# Patient Record
Sex: Male | Born: 1955 | Race: White | Hispanic: No | Smoking: Former smoker
Health system: Southern US, Community
[De-identification: ages and names within clinical notes are randomized; demographics above are authoritative.]

## PROBLEM LIST (undated history)

## (undated) DIAGNOSIS — M109 Gout, unspecified: Secondary | ICD-10-CM

## (undated) DIAGNOSIS — R3915 Urgency of urination: Secondary | ICD-10-CM

## (undated) DIAGNOSIS — Z8719 Personal history of other diseases of the digestive system: Secondary | ICD-10-CM

## (undated) DIAGNOSIS — N201 Calculus of ureter: Secondary | ICD-10-CM

## (undated) DIAGNOSIS — R350 Frequency of micturition: Secondary | ICD-10-CM

## (undated) DIAGNOSIS — M1990 Unspecified osteoarthritis, unspecified site: Secondary | ICD-10-CM

## (undated) DIAGNOSIS — E039 Hypothyroidism, unspecified: Secondary | ICD-10-CM

## (undated) DIAGNOSIS — Z87442 Personal history of urinary calculi: Secondary | ICD-10-CM

## (undated) DIAGNOSIS — Z8709 Personal history of other diseases of the respiratory system: Secondary | ICD-10-CM

## (undated) DIAGNOSIS — D649 Anemia, unspecified: Secondary | ICD-10-CM

## (undated) DIAGNOSIS — N135 Crossing vessel and stricture of ureter without hydronephrosis: Secondary | ICD-10-CM

## (undated) DIAGNOSIS — Z973 Presence of spectacles and contact lenses: Secondary | ICD-10-CM

## (undated) HISTORY — PX: TONSILLECTOMY: SUR1361

---

## 2001-02-16 ENCOUNTER — Ambulatory Visit (HOSPITAL_COMMUNITY): Admission: RE | Admit: 2001-02-16 | Discharge: 2001-02-16 | Payer: Self-pay | Admitting: Gastroenterology

## 2011-03-12 ENCOUNTER — Emergency Department (HOSPITAL_COMMUNITY)
Admission: EM | Admit: 2011-03-12 | Discharge: 2011-03-12 | Disposition: A | Discharge status: Home / Self Care | Payer: 59 | Attending: Emergency Medicine | Admitting: Emergency Medicine

## 2011-03-12 ENCOUNTER — Emergency Department (HOSPITAL_COMMUNITY): Payer: 59

## 2011-03-12 DIAGNOSIS — K219 Gastro-esophageal reflux disease without esophagitis: Secondary | ICD-10-CM | POA: Insufficient documentation

## 2011-03-12 DIAGNOSIS — M109 Gout, unspecified: Secondary | ICD-10-CM | POA: Insufficient documentation

## 2011-03-12 DIAGNOSIS — R109 Unspecified abdominal pain: Secondary | ICD-10-CM | POA: Insufficient documentation

## 2011-03-12 DIAGNOSIS — N201 Calculus of ureter: Secondary | ICD-10-CM | POA: Insufficient documentation

## 2011-03-12 DIAGNOSIS — R1031 Right lower quadrant pain: Secondary | ICD-10-CM | POA: Insufficient documentation

## 2011-05-10 ENCOUNTER — Ambulatory Visit: Payer: Self-pay | Admitting: Urology

## 2011-05-17 ENCOUNTER — Ambulatory Visit: Payer: Self-pay | Admitting: Urology

## 2011-06-03 ENCOUNTER — Ambulatory Visit: Payer: Self-pay | Admitting: Urology

## 2011-06-15 HISTORY — PX: EXTRACORPOREAL SHOCK WAVE LITHOTRIPSY: SHX1557

## 2011-06-17 ENCOUNTER — Ambulatory Visit: Payer: Self-pay | Admitting: Urology

## 2011-07-21 ENCOUNTER — Ambulatory Visit: Payer: Self-pay | Admitting: Urology

## 2012-09-20 ENCOUNTER — Encounter (HOSPITAL_COMMUNITY): Payer: Self-pay | Admitting: Emergency Medicine

## 2012-09-20 ENCOUNTER — Emergency Department (HOSPITAL_COMMUNITY): Payer: 59

## 2012-09-20 ENCOUNTER — Emergency Department (HOSPITAL_COMMUNITY)
Admission: EM | Admit: 2012-09-20 | Discharge: 2012-09-21 | Disposition: A | Discharge status: Home / Self Care | Payer: 59 | Attending: Emergency Medicine | Admitting: Emergency Medicine

## 2012-09-20 DIAGNOSIS — R1084 Generalized abdominal pain: Secondary | ICD-10-CM | POA: Insufficient documentation

## 2012-09-20 DIAGNOSIS — Z79899 Other long term (current) drug therapy: Secondary | ICD-10-CM | POA: Insufficient documentation

## 2012-09-20 DIAGNOSIS — D649 Anemia, unspecified: Secondary | ICD-10-CM | POA: Insufficient documentation

## 2012-09-20 DIAGNOSIS — R141 Gas pain: Secondary | ICD-10-CM | POA: Insufficient documentation

## 2012-09-20 DIAGNOSIS — K449 Diaphragmatic hernia without obstruction or gangrene: Secondary | ICD-10-CM

## 2012-09-20 DIAGNOSIS — R109 Unspecified abdominal pain: Secondary | ICD-10-CM

## 2012-09-20 DIAGNOSIS — R142 Eructation: Secondary | ICD-10-CM | POA: Insufficient documentation

## 2012-09-20 DIAGNOSIS — R112 Nausea with vomiting, unspecified: Secondary | ICD-10-CM

## 2012-09-20 DIAGNOSIS — R6889 Other general symptoms and signs: Secondary | ICD-10-CM | POA: Insufficient documentation

## 2012-09-20 DIAGNOSIS — R61 Generalized hyperhidrosis: Secondary | ICD-10-CM | POA: Insufficient documentation

## 2012-09-20 DIAGNOSIS — R42 Dizziness and giddiness: Secondary | ICD-10-CM | POA: Insufficient documentation

## 2012-09-20 HISTORY — DX: Anemia, unspecified: D64.9

## 2012-09-20 LAB — COMPREHENSIVE METABOLIC PANEL
ALT: 23 U/L (ref 0–53)
Alkaline Phosphatase: 63 U/L (ref 39–117)
CO2: 22 mEq/L (ref 19–32)
Chloride: 104 mEq/L (ref 96–112)
GFR calc Af Amer: 80 mL/min — ABNORMAL LOW (ref >90)
GFR calc non Af Amer: 69 mL/min — ABNORMAL LOW (ref >90)
Glucose, Bld: 99 mg/dL (ref 70–99)
Potassium: 3.7 mEq/L (ref 3.5–5.1)
Sodium: 142 mEq/L (ref 135–145)

## 2012-09-20 LAB — CBC WITH DIFFERENTIAL/PLATELET
Lymphocytes Relative: 12 % (ref 12–46)
Lymphs Abs: 1.5 10*3/uL (ref 0.7–4.0)
MCV: 74 fL — ABNORMAL LOW (ref 78.0–100.0)
Neutrophils Relative %: 81 % — ABNORMAL HIGH (ref 43–77)
Platelets: 306 10*3/uL (ref 150–400)
RBC: 5.34 MIL/uL (ref 4.22–5.81)
WBC: 12.6 10*3/uL — ABNORMAL HIGH (ref 4.0–10.5)

## 2012-09-20 LAB — GLUCOSE, CAPILLARY

## 2012-09-20 MED ORDER — METOCLOPRAMIDE HCL 5 MG/ML IJ SOLN
10.0000 mg | Freq: Once | INTRAMUSCULAR | Status: AC
  Administered 2012-09-20: 10 mg via INTRAVENOUS
  Filled 2012-09-20: qty 2

## 2012-09-20 MED ORDER — IOHEXOL 300 MG/ML  SOLN
50.0000 mL | Freq: Once | INTRAMUSCULAR | Status: AC | PRN
  Administered 2012-09-20: 50 mL via ORAL

## 2012-09-20 MED ORDER — SODIUM CHLORIDE 0.9 % IV BOLUS (SEPSIS)
1000.0000 mL | Freq: Once | INTRAVENOUS | Status: AC
  Administered 2012-09-20: 1000 mL via INTRAVENOUS

## 2012-09-20 MED ORDER — ACETAMINOPHEN 325 MG PO TABS
650.0000 mg | ORAL_TABLET | Freq: Once | ORAL | Status: AC
  Administered 2012-09-20: 650 mg via ORAL
  Filled 2012-09-20: qty 1

## 2012-09-20 MED ORDER — ONDANSETRON 8 MG PO TBDP: ORAL_TABLET | ORAL | Status: DC

## 2012-09-20 MED ORDER — ONDANSETRON HCL 4 MG/2ML IJ SOLN
4.0000 mg | Freq: Once | INTRAMUSCULAR | Status: AC
  Administered 2012-09-20: 4 mg via INTRAVENOUS
  Filled 2012-09-20: qty 2

## 2012-09-20 MED ORDER — IOHEXOL 300 MG/ML  SOLN
100.0000 mL | Freq: Once | INTRAMUSCULAR | Status: AC | PRN
  Administered 2012-09-20: 100 mL via INTRAVENOUS

## 2012-09-20 NOTE — ED Provider Notes (Signed)
Caidynce Muzyka S 8:30 PM patient discussed in sign out with Tatyana Kirichenko PA-C.  Patient with acute onset generalized fatigue, weakness, nausea and vomiting. Also having some subjective chills. No diarrhea symptoms. No recent travel or known sick contacts. Patient does have some periumbilical abdominal discomforts on exam no peritoneal signs. CT scan pending to rule out any concerning or acute abdominal process.  Patient does report improvement of nausea. A CT scan did not show any significant findings there is a hiatal hernia the patient is aware of. Slight hepatic steatosis and some nonobstructing kidney stones. Will continue to give fluids.  Patient now sleeping comfortably continues to feel improved. He is able to tolerate by mouth fluids. His symptoms may represent viral syndrome. Patient encouraged to followup with PCP.  Angus Seller, PA-C 09/21/12 Jeralyn Bennett

## 2012-09-20 NOTE — ED Provider Notes (Signed)
History     CSN: [id number]  Arrival date & time 09/20/12  1745   First MD Initiated Contact with Patient 09/20/12 1838      Chief Complaint  Patient presents with  . Weakness  . Abnormal Lab    (Consider location/radiation/quality/duration/timing/severity/associated sxs/prior treatment) HPI Jay Mercer is a 57 y.o. male who presents to ED with complaint of weakness, dizziness, nausea, vomiting, onset about 2-3hrs ago while at a store. States symptoms occurred suddenly. States having also abdominal distention. States got dizzy diaphoretic, went to drink some water, and started vomiting "yellow bile." States hx of anemia, has not been taking his iron. Denies chest pain or shortness of breath. No other medical problems.    Past Medical History  Diagnosis Date  . Anemia     No past surgical history on file.  No family history on file.  History  Substance Use Topics  . Smoking status: Not on file  . Smokeless tobacco: Not on file  . Alcohol Use: Not on file      Review of Systems  Constitutional: Positive for fatigue. Negative for fever and chills.  HENT: Negative for neck pain.   Respiratory: Negative.   Cardiovascular: Negative.   Gastrointestinal: Positive for nausea, vomiting and abdominal pain. Negative for diarrhea, constipation and blood in stool.  Neurological: Positive for dizziness, weakness and light-headedness. Negative for headaches.  All other systems reviewed and are negative.    Allergies  Review of patient's allergies indicates no known allergies.  Home Medications   Current Outpatient Rx  Name  Route  Sig  Dispense  Refill  . colchicine 0.6 MG tablet   Oral   Take 0.6 mg by mouth daily.         . ferrous gluconate (FERGON) 324 MG tablet   Oral   Take 324 mg by mouth daily with breakfast.         . fish oil-omega-3 fatty acids 1000 MG capsule   Oral   Take 2 g by mouth daily.         Marland Kitchen levothyroxine (SYNTHROID, LEVOTHROID) 75  MCG tablet   Oral   Take 75 mcg by mouth daily before breakfast.         . pantoprazole (PROTONIX) 40 MG tablet   Oral   Take 40 mg by mouth daily.           BP 104/83  Pulse 110  Temp(Src) 98.6 F (37 C) (Oral)  SpO2 99%  Physical Exam  Nursing note and vitals reviewed. Constitutional: He is oriented to person, place, and time. He appears well-developed and well-nourished. No distress.  HENT:  Head: Normocephalic.  Eyes: Conjunctivae are normal.  Neck: Neck supple.  Cardiovascular: Normal rate, regular rhythm and normal heart sounds.   Pulmonary/Chest: Effort normal and breath sounds normal. No respiratory distress. He has no wheezes. He has no rales.  Abdominal: Soft. Bowel sounds are normal. He exhibits no distension. There is tenderness. There is no rebound.  Mild diffuse tenderness  Musculoskeletal: He exhibits no edema.  Neurological: He is alert and oriented to person, place, and time. No cranial nerve deficit. Coordination normal.  Skin: Skin is warm and dry.    ED Course  Procedures (including critical care time)   Date: 09/20/2012  Rate: 99  Rhythm: normal sinus rhythm  QRS Axis: normal  Intervals: normal  ST/T Wave abnormalities: normal  Conduction Disutrbances:right bundle branch block  Narrative Interpretation:   Old EKG Reviewed: none  available  Results for orders placed during the hospital encounter of 09/20/12  CBC WITH DIFFERENTIAL      Result Value Range   WBC 12.6 (*) 4.0 - 10.5 K/uL   RBC 5.34  4.22 - 5.81 MIL/uL   Hemoglobin 12.3 (*) 13.0 - 17.0 g/dL   HCT 16.1  09.6 - 04.5 %   MCV 74.0 (*) 78.0 - 100.0 fL   MCH 23.0 (*) 26.0 - 34.0 pg   MCHC 31.1  30.0 - 36.0 g/dL   RDW 40.9  81.1 - 91.4 %   Platelets 306  150 - 400 K/uL   Neutrophils Relative 81 (*) 43 - 77 %   Neutro Abs 10.2 (*) 1.7 - 7.7 K/uL   Lymphocytes Relative 12  12 - 46 %   Lymphs Abs 1.5  0.7 - 4.0 K/uL   Monocytes Relative 6  3 - 12 %   Monocytes Absolute 0.8  0.1 -  1.0 K/uL   Eosinophils Relative 1  0 - 5 %   Eosinophils Absolute 0.2  0.0 - 0.7 K/uL   Basophils Relative 1  0 - 1 %   Basophils Absolute 0.1  0.0 - 0.1 K/uL  COMPREHENSIVE METABOLIC PANEL      Result Value Range   Sodium 142  135 - 145 mEq/L   Potassium 3.7  3.5 - 5.1 mEq/L   Chloride 104  96 - 112 mEq/L   CO2 22  19 - 32 mEq/L   Glucose, Bld 99  70 - 99 mg/dL   BUN 18  6 - 23 mg/dL   Creatinine, Ser 7.82  0.50 - 1.35 mg/dL   Calcium 9.6  8.4 - 95.6 mg/dL   Total Protein 7.9  6.0 - 8.3 g/dL   Albumin 4.3  3.5 - 5.2 g/dL   AST 23  0 - 37 U/L   ALT 23  0 - 53 U/L   Alkaline Phosphatase 63  39 - 117 U/L   Total Bilirubin 1.1  0.3 - 1.2 mg/dL   GFR calc non Af Amer 69 (*) >90 mL/min   GFR calc Af Amer 80 (*) >90 mL/min  LIPASE, BLOOD      Result Value Range   Lipase 51  11 - 59 U/L  TROPONIN I      Result Value Range   Troponin I <0.30  <0.30 ng/mL  GLUCOSE, CAPILLARY      Result Value Range   Glucose-Capillary 112 (*) 70 - 99 mg/dL   Comment 1 Notify RN    POCT I-STAT TROPONIN I      Result Value Range   Troponin i, poc 0.02  0.00 - 0.08 ng/mL   Comment 3             Dg Abd Acute W/chest  09/20/2012  *RADIOLOGY REPORT*  Clinical Data: Abdominal distention and pain  ACUTE ABDOMEN SERIES (ABDOMEN 2 VIEW & CHEST 1 VIEW)  Comparison: 09/22/2011  Findings: Normal heart size.  No pleural effusion or edema.  There is no airspace consolidation identified.  Moderate size hiatal hernia is noted.  Nonspecific bowel gas pattern.  Gas and stool noted throughout the colon.  A few prominent loops of small bowel are noted.  And there are several air-fluid levels in the right lower quadrant of the abdomen.  No abnormal abdominal or pelvic calcifications identified.  IMPRESSION:  1.  No acute findings.  No active cardiopulmonary abnormalities. 2.  Hiatal hernia.   Original Report Authenticated  By: Signa Kell, M.D.    Pt with abdominal pain, vomiting. CT abd/pelvis ordered. Pt signed out with  PA dammen at shift change. Disposition based on CT abd/pelvis results.      No diagnosis found.    MDM          Lottie Mussel, PA-C 09/23/12 0132

## 2012-09-20 NOTE — ED Notes (Signed)
Pt states that he was trying to buy a washer today when he became, dizzy, lightheaded, and nauseated.  States hx of low hemoglobin and says that his doctors never figured out where he was bleeding from.  Pt is currently diaphoretic.  Pt c/o abd pain and distention.  Pt has vomited once.  States it was yellow.

## 2012-09-20 NOTE — ED Notes (Signed)
Pt has been having low hgb for a while and has been scoped and they have not been able to find out the reason for this. Pt today has not been feeling well and weak, pale, states that his abd feels distended.

## 2012-09-20 NOTE — ED Notes (Signed)
[id number]<AV> Expected date:<BR> Expected time:<BR> Means of arrival:<BR> Comments:<BR> No monitor

## 2012-09-21 NOTE — ED Provider Notes (Signed)
Medical screening examination/treatment/procedure(s) were performed by non-physician practitioner and as supervising physician I was immediately available for consultation/collaboration.  Shacola Schussler T Lukasz Rogus, MD 09/21/12 1847 

## 2012-09-23 NOTE — ED Provider Notes (Signed)
Medical screening examination/treatment/procedure(s) were performed by non-physician practitioner and as supervising physician I was immediately available for consultation/collaboration.  Carigan Lister T Sheretha Shadd, MD 09/23/12 0744 

## 2013-09-21 ENCOUNTER — Encounter (HOSPITAL_COMMUNITY): Payer: Self-pay | Admitting: Emergency Medicine

## 2013-09-21 ENCOUNTER — Emergency Department (HOSPITAL_COMMUNITY): Payer: 59

## 2013-09-21 ENCOUNTER — Emergency Department (HOSPITAL_COMMUNITY)
Admission: EM | Admit: 2013-09-21 | Discharge: 2013-09-21 | Disposition: A | Discharge status: Home / Self Care | Payer: 59 | Attending: Emergency Medicine | Admitting: Emergency Medicine

## 2013-09-21 DIAGNOSIS — Z87442 Personal history of urinary calculi: Secondary | ICD-10-CM | POA: Insufficient documentation

## 2013-09-21 DIAGNOSIS — E039 Hypothyroidism, unspecified: Secondary | ICD-10-CM | POA: Insufficient documentation

## 2013-09-21 DIAGNOSIS — N201 Calculus of ureter: Secondary | ICD-10-CM

## 2013-09-21 DIAGNOSIS — Z79899 Other long term (current) drug therapy: Secondary | ICD-10-CM | POA: Insufficient documentation

## 2013-09-21 LAB — BASIC METABOLIC PANEL
BUN: 18 mg/dL (ref 6–23)
CO2: 21 mEq/L (ref 19–32)
CREATININE: 1.25 mg/dL (ref 0.50–1.35)
Calcium: 9.4 mg/dL (ref 8.4–10.5)
Chloride: 103 mEq/L (ref 96–112)
GFR, EST AFRICAN AMERICAN: 73 mL/min — AB (ref >90)
GFR, EST NON AFRICAN AMERICAN: 63 mL/min — AB (ref >90)
Glucose, Bld: 122 mg/dL — ABNORMAL HIGH (ref 70–99)
POTASSIUM: 3.8 meq/L (ref 3.7–5.3)
Sodium: 140 mEq/L (ref 137–147)

## 2013-09-21 LAB — CBC WITH DIFFERENTIAL/PLATELET
BASOS ABS: 0.1 10*3/uL (ref 0.0–0.1)
BASOS PCT: 1 % (ref 0–1)
EOS ABS: 0.2 10*3/uL (ref 0.0–0.7)
Eosinophils Relative: 3 % (ref 0–5)
HCT: 36.8 % — ABNORMAL LOW (ref 39.0–52.0)
HEMOGLOBIN: 11.8 g/dL — AB (ref 13.0–17.0)
Lymphocytes Relative: 45 % (ref 12–46)
Lymphs Abs: 3.5 10*3/uL (ref 0.7–4.0)
MCH: 24.8 pg — AB (ref 26.0–34.0)
MCHC: 32.1 g/dL (ref 30.0–36.0)
MCV: 77.3 fL — ABNORMAL LOW (ref 78.0–100.0)
MONO ABS: 0.7 10*3/uL (ref 0.1–1.0)
MONOS PCT: 8 % (ref 3–12)
NEUTROS PCT: 43 % (ref 43–77)
Neutro Abs: 3.4 10*3/uL (ref 1.7–7.7)
Platelets: 272 10*3/uL (ref 150–400)
RBC: 4.76 MIL/uL (ref 4.22–5.81)
RDW: 15.1 % (ref 11.5–15.5)
WBC: 7.9 10*3/uL (ref 4.0–10.5)

## 2013-09-21 LAB — URINALYSIS, ROUTINE W REFLEX MICROSCOPIC
Glucose, UA: NEGATIVE mg/dL
KETONES UR: NEGATIVE mg/dL
NITRITE: NEGATIVE
Protein, ur: 30 mg/dL — AB
Specific Gravity, Urine: 1.026 (ref 1.005–1.030)
UROBILINOGEN UA: 0.2 mg/dL (ref 0.0–1.0)
pH: 5 (ref 5.0–8.0)

## 2013-09-21 LAB — URINE MICROSCOPIC-ADD ON

## 2013-09-21 MED ORDER — MORPHINE SULFATE 4 MG/ML IJ SOLN
4.0000 mg | INTRAMUSCULAR | Status: DC | PRN
  Administered 2013-09-21 (×2): 4 mg via INTRAVENOUS
  Filled 2013-09-21 (×2): qty 1

## 2013-09-21 MED ORDER — HYDROCODONE-ACETAMINOPHEN 5-325 MG PO TABS: 1.0000 | ORAL_TABLET | Freq: Four times a day (QID) | ORAL | Status: DC | PRN

## 2013-09-21 MED ORDER — IBUPROFEN 600 MG PO TABS: 600.0000 mg | ORAL_TABLET | Freq: Four times a day (QID) | ORAL | Status: DC | PRN

## 2013-09-21 MED ORDER — OXYCODONE-ACETAMINOPHEN 5-325 MG PO TABS
1.0000 | ORAL_TABLET | Freq: Once | ORAL | Status: AC
  Administered 2013-09-21: 2 via ORAL
  Filled 2013-09-21: qty 2

## 2013-09-21 MED ORDER — KETOROLAC TROMETHAMINE 30 MG/ML IJ SOLN
30.0000 mg | Freq: Once | INTRAMUSCULAR | Status: AC
  Administered 2013-09-21: 30 mg via INTRAVENOUS
  Filled 2013-09-21: qty 1

## 2013-09-21 MED ORDER — ONDANSETRON 8 MG PO TBDP
8.0000 mg | ORAL_TABLET | Freq: Once | ORAL | Status: AC
  Administered 2013-09-21: 8 mg via ORAL
  Filled 2013-09-21: qty 1

## 2013-09-21 MED ORDER — ONDANSETRON 8 MG PO TBDP: 8.0000 mg | ORAL_TABLET | Freq: Three times a day (TID) | ORAL | Status: DC | PRN

## 2013-09-21 MED ORDER — TAMSULOSIN HCL 0.4 MG PO CAPS: 0.4000 mg | ORAL_CAPSULE | Freq: Once | ORAL | Status: DC

## 2013-09-21 MED ORDER — IBUPROFEN 200 MG PO TABS
600.0000 mg | ORAL_TABLET | Freq: Once | ORAL | Status: AC
  Administered 2013-09-21: 600 mg via ORAL
  Filled 2013-09-21: qty 3

## 2013-09-21 NOTE — ED Notes (Signed)
Pt got up and reports feeling nauseaous.  Dr. Rhunette CroftNanavati made aware.

## 2013-09-21 NOTE — ED Notes (Signed)
Pt reports dizziness upon standing.

## 2013-09-21 NOTE — ED Provider Notes (Signed)
CSN: [id number]     Arrival date & time 09/21/13  0003 History   First MD Initiated Contact with Patient 09/21/13 0022     Chief Complaint  Patient presents with  . Flank Pain     (Consider location/radiation/quality/duration/timing/severity/associated sxs/prior Treatment) HPI Comments: 58 y/o with hx of renal stones comes in with cc of flank pain. Pt has left flank pain, sudden onset, severe, and non radiating. There is no n/v/f/c/uti like sx. + hx of renal stones, left sided, required intervention on one occasion. Pain is similar to his renal stones.  Patient is a 59 y.o. male presenting with flank pain. The history is provided by the patient.  Flank Pain Pertinent negatives include no chest pain and no abdominal pain.    Past Medical History  Diagnosis Date  . Anemia   . Kidney stones   . Hypothyroid    Past Surgical History  Procedure Laterality Date  . Tonsillectomy     Family History  Problem Relation Age of Onset  . Adopted: Yes   History  Substance Use Topics  . Smoking status: Never Smoker   . Smokeless tobacco: Not on file  . Alcohol Use: Yes    Review of Systems  Cardiovascular: Negative for chest pain.  Gastrointestinal: Negative for nausea, vomiting and abdominal pain.  Genitourinary: Positive for flank pain. Negative for dysuria and testicular pain.  All other systems reviewed and are negative.     Allergies  Review of patient's allergies indicates no known allergies.  Home Medications   Current Outpatient Rx  Name  Route  Sig  Dispense  Refill  . levothyroxine (SYNTHROID, LEVOTHROID) 75 MCG tablet   Oral   Take 75 mcg by mouth daily before breakfast.         . pantoprazole (PROTONIX) 40 MG tablet   Oral   Take 40 mg by mouth daily.         Marland Kitchen HYDROcodone-acetaminophen (NORCO/VICODIN) 5-325 MG per tablet   Oral   Take 1 tablet by mouth every 6 (six) hours as needed.   15 tablet   0   . ibuprofen (ADVIL,MOTRIN) 600 MG tablet    Oral   Take 1 tablet (600 mg total) by mouth every 6 (six) hours as needed.   30 tablet   0   . ondansetron (ZOFRAN-ODT) 8 MG disintegrating tablet   Oral   Take 1 tablet (8 mg total) by mouth every 8 (eight) hours as needed for nausea or vomiting.   15 tablet   0   . tamsulosin (FLOMAX) 0.4 MG CAPS capsule   Oral   Take 1 capsule (0.4 mg total) by mouth once.   10 capsule   0    BP 115/74  Pulse 63  Temp(Src) 97.3 F (36.3 C) (Oral)  Resp 14  Ht 6\' 1"  (1.854 m)  Wt 250 lb (113.399 kg)  BMI 32.99 kg/m2  SpO2 98% Physical Exam  Nursing note and vitals reviewed. Constitutional: He is oriented to person, place, and time. He appears well-developed.  HENT:  Head: Normocephalic and atraumatic.  Eyes: Conjunctivae and EOM are normal. Pupils are equal, round, and reactive to light.  Neck: Normal range of motion. Neck supple.  Cardiovascular: Normal rate and regular rhythm.   Pulmonary/Chest: Effort normal and breath sounds normal.  Abdominal: Soft. Bowel sounds are normal. He exhibits no distension. There is no tenderness. There is no rebound and no guarding.  Left flank tenderness  Neurological: He is alert  and oriented to person, place, and time.  Skin: Skin is warm.    ED Course  Procedures (including critical care time) Labs Review Labs Reviewed  CBC WITH DIFFERENTIAL - Abnormal; Notable for the following:    Hemoglobin 11.8 (*)    HCT 36.8 (*)    MCV 77.3 (*)    MCH 24.8 (*)    All other components within normal limits  BASIC METABOLIC PANEL - Abnormal; Notable for the following:    Glucose, Bld 122 (*)    GFR calc non Af Amer 63 (*)    GFR calc Af Amer 73 (*)    All other components within normal limits  URINALYSIS, ROUTINE W REFLEX MICROSCOPIC - Abnormal; Notable for the following:    Color, Urine AMBER (*)    APPearance CLOUDY (*)    Hgb urine dipstick LARGE (*)    Bilirubin Urine SMALL (*)    Protein, ur 30 (*)    Leukocytes, UA TRACE (*)    All other  components within normal limits  URINE MICROSCOPIC-ADD ON - Abnormal; Notable for the following:    Bacteria, UA FEW (*)    All other components within normal limits   Imaging Review [facility]  09/21/2013   CLINICAL DATA:  Left flank pain  EXAM: RENAL/URINARY TRACT ULTRASOUND COMPLETE  COMPARISON:  None.  FINDINGS: Right Kidney:  Length: 12 cm. Echogenicity within normal limits. No mass or hydronephrosis visualized.  Left Kidney:  Length: 13 cm. Mild pelvicaliectasis. No evidence of mass. Normal cortical echogenicity.  Bladder:  Nondilated bladder. Ureteral jets were not captured bilaterally. Enlarged prostate with echogenic foci centrally consistent with dystrophic calcification. The prostate measures up to 5 cm in diameter, and deforms the bladder base.  Echogenic liver, consistent with steatosis.  IMPRESSION: 1. Left pelvicaliectasis without overt hydronephrosis. 2. Enlarged prostate. 3. Hepatic steatosis.   Electronically Signed   By: Tiburcio PeaJonathan  Watts M.D.   On: 09/21/2013 04:08     EKG Interpretation None      MDM   Final diagnoses:  Ureterolithiasis   Pt comes in with cc of flank pain. Hx of renal stones, and pain is typical. We went ahead and did an ultrasound, no renal stone seen, and no severe hydro. Labs, including UA are normal.  Pt's pain responded well to Toradol and morphine. Went down to 2/10. We gave him oral percocet, which he tolerated. Ready for discharge. Return precautions discussed.    Derwood KaplanAnkit Daxx Tiggs, MD 09/21/13 [phone]

## 2013-09-21 NOTE — ED Notes (Signed)
Pt states he woke about an hour ago with left flank pain  Pt has hx of stones  Pt states last time he was here for this they gave him dilaudid and it did not help the pain  Pt denies N/V

## 2013-09-21 NOTE — Discharge Instructions (Signed)
You have a renal stone. Likely will pass, however, some stones dont pass and need Urologist to help. Please return to the ER if your symptoms worsen; you have increased pain, fevers, chills, inability to keep any medications down, confusion. Otherwise see the outpatient doctor as requested.   Ureteral Colic (Kidney Stones) Ureteral colic is the result of a condition when kidney stones form inside the kidney. Once kidney stones are formed they may move into the tube that connects the kidney with the bladder (ureter). If this occurs, this condition may cause pain (colic) in the ureter.  CAUSES  Pain is caused by stone movement in the ureter and the obstruction caused by the stone. SYMPTOMS  The pain comes and goes as the ureter contracts around the stone. The pain is usually intense, sharp, and stabbing in character. The location of the pain may move as the stone moves through the ureter. When the stone is near the kidney the pain is usually located in the back and radiates to the belly (abdomen). When the stone is ready to pass into the bladder the pain is often located in the lower abdomen on the side the stone is located. At this location, the symptoms may mimic those of a urinary tract infection with urinary frequency. Once the stone is located here it often passes into the bladder and the pain disappears completely. TREATMENT   Your caregiver will provide you with medicine for pain relief.  You may require specialized follow-up X-rays.  The absence of pain does not always mean that the stone has passed. It may have just stopped moving. If the urine remains completely obstructed, it can cause loss of kidney function or even complete destruction of the involved kidney. It is your responsibility and in your interest that X-rays and follow-ups as suggested by your caregiver are completed. Relief of pain without passage of the stone can be associated with severe damage to the kidney, including loss of  kidney function on that side.  If your stone does not pass on its own, additional measures may be taken by your caregiver to ensure its removal. HOME CARE INSTRUCTIONS   Increase your fluid intake. Water is the preferred fluid since juices containing vitamin C may acidify the urine making it less likely for certain stones (uric acid stones) to pass.  Strain all urine. A strainer will be provided. Keep all particulate matter or stones for your caregiver to inspect.  Take your pain medicine as directed.  Make a follow-up appointment with your caregiver as directed.  Remember that the goal is passage of your stone. The absence of pain does not mean the stone is gone. Follow your caregiver's instructions.  Only take over-the-counter or prescription medicines for pain, discomfort, or fever as directed by your caregiver. SEEK MEDICAL CARE IF:   Pain cannot be controlled with the prescribed medicine.  You have a fever.  Pain continues for longer than your caregiver advises it should.  There is a change in the pain, and you develop chest discomfort or constant abdominal pain.  You feel faint or pass out. MAKE SURE YOU:   Understand these instructions.  Will watch your condition.  Will get help right away if you are not doing well or get worse. Document Released: 03/10/2005 Document Revised: 09/25/2012 Document Reviewed: 11/25/2010 [facility] Patient Information 2014 [place], [place].

## 2013-09-22 ENCOUNTER — Encounter (HOSPITAL_COMMUNITY): Payer: 59 | Admitting: Anesthesiology

## 2013-09-22 ENCOUNTER — Encounter (HOSPITAL_COMMUNITY): Payer: Self-pay | Admitting: Emergency Medicine

## 2013-09-22 ENCOUNTER — Encounter (HOSPITAL_COMMUNITY)
Admission: EM | Disposition: A | Discharge status: Home / Self Care | Payer: Self-pay | Source: Home / Self Care | Attending: Emergency Medicine

## 2013-09-22 ENCOUNTER — Emergency Department (HOSPITAL_COMMUNITY): Payer: 59

## 2013-09-22 ENCOUNTER — Observation Stay (HOSPITAL_COMMUNITY)
Admission: EM | Admit: 2013-09-22 | Discharge: 2013-09-22 | Disposition: A | Discharge status: Home / Self Care | Payer: 59 | Attending: Emergency Medicine | Admitting: Emergency Medicine

## 2013-09-22 ENCOUNTER — Emergency Department (HOSPITAL_COMMUNITY): Payer: 59 | Admitting: Anesthesiology

## 2013-09-22 DIAGNOSIS — N200 Calculus of kidney: Secondary | ICD-10-CM

## 2013-09-22 DIAGNOSIS — K219 Gastro-esophageal reflux disease without esophagitis: Secondary | ICD-10-CM | POA: Insufficient documentation

## 2013-09-22 DIAGNOSIS — N1330 Unspecified hydronephrosis: Secondary | ICD-10-CM | POA: Insufficient documentation

## 2013-09-22 DIAGNOSIS — N201 Calculus of ureter: Principal | ICD-10-CM | POA: Insufficient documentation

## 2013-09-22 DIAGNOSIS — N135 Crossing vessel and stricture of ureter without hydronephrosis: Secondary | ICD-10-CM

## 2013-09-22 DIAGNOSIS — E039 Hypothyroidism, unspecified: Secondary | ICD-10-CM | POA: Insufficient documentation

## 2013-09-22 DIAGNOSIS — S37009A Unspecified injury of unspecified kidney, initial encounter: Secondary | ICD-10-CM

## 2013-09-22 DIAGNOSIS — D649 Anemia, unspecified: Secondary | ICD-10-CM | POA: Insufficient documentation

## 2013-09-22 HISTORY — PX: CYSTOSCOPY W/ URETERAL STENT PLACEMENT: SHX1429

## 2013-09-22 LAB — URINALYSIS, ROUTINE W REFLEX MICROSCOPIC
GLUCOSE, UA: NEGATIVE mg/dL
Ketones, ur: 40 mg/dL — AB
LEUKOCYTES UA: NEGATIVE
Nitrite: NEGATIVE
PH: 5.5 (ref 5.0–8.0)
PROTEIN: NEGATIVE mg/dL
SPECIFIC GRAVITY, URINE: 1.024 (ref 1.005–1.030)
Urobilinogen, UA: 0.2 mg/dL (ref 0.0–1.0)

## 2013-09-22 LAB — CBC WITH DIFFERENTIAL/PLATELET
BASOS ABS: 0 10*3/uL (ref 0.0–0.1)
Basophils Relative: 0 % (ref 0–1)
Eosinophils Absolute: 0 10*3/uL (ref 0.0–0.7)
Eosinophils Relative: 0 % (ref 0–5)
HCT: 38.2 % — ABNORMAL LOW (ref 39.0–52.0)
Hemoglobin: 12 g/dL — ABNORMAL LOW (ref 13.0–17.0)
Lymphocytes Relative: 12 % (ref 12–46)
Lymphs Abs: 1.7 10*3/uL (ref 0.7–4.0)
MCH: 24.5 pg — ABNORMAL LOW (ref 26.0–34.0)
MCHC: 31.4 g/dL (ref 30.0–36.0)
MCV: 78 fL (ref 78.0–100.0)
Monocytes Absolute: 1 10*3/uL (ref 0.1–1.0)
Monocytes Relative: 7 % (ref 3–12)
NEUTROS ABS: 11.8 10*3/uL — AB (ref 1.7–7.7)
Neutrophils Relative %: 81 % — ABNORMAL HIGH (ref 43–77)
PLATELETS: 262 10*3/uL (ref 150–400)
RBC: 4.9 MIL/uL (ref 4.22–5.81)
RDW: 15.1 % (ref 11.5–15.5)
WBC: 14.6 10*3/uL — AB (ref 4.0–10.5)

## 2013-09-22 LAB — BASIC METABOLIC PANEL
BUN: 22 mg/dL (ref 6–23)
CALCIUM: 9.4 mg/dL (ref 8.4–10.5)
CHLORIDE: 100 meq/L (ref 96–112)
CO2: 22 mEq/L (ref 19–32)
Creatinine, Ser: 1.92 mg/dL — ABNORMAL HIGH (ref 0.50–1.35)
GFR calc Af Amer: 43 mL/min — ABNORMAL LOW (ref >90)
GFR calc non Af Amer: 37 mL/min — ABNORMAL LOW (ref >90)
GLUCOSE: 114 mg/dL — AB (ref 70–99)
POTASSIUM: 4.1 meq/L (ref 3.7–5.3)
SODIUM: 139 meq/L (ref 137–147)

## 2013-09-22 LAB — URINE MICROSCOPIC-ADD ON

## 2013-09-22 SURGERY — CYSTOSCOPY, WITH RETROGRADE PYELOGRAM AND URETERAL STENT INSERTION
Anesthesia: General | Laterality: Left

## 2013-09-22 MED ORDER — HYDROMORPHONE HCL PF 1 MG/ML IJ SOLN
1.0000 mg | Freq: Once | INTRAMUSCULAR | Status: AC
  Administered 2013-09-22: 1 mg via INTRAVENOUS
  Filled 2013-09-22: qty 1

## 2013-09-22 MED ORDER — LIDOCAINE HCL (CARDIAC) 20 MG/ML IV SOLN
INTRAVENOUS | Status: AC
  Filled 2013-09-22: qty 5

## 2013-09-22 MED ORDER — CEFAZOLIN SODIUM-DEXTROSE 2-3 GM-% IV SOLR
2.0000 g | Freq: Three times a day (TID) | INTRAVENOUS | Status: DC
  Administered 2013-09-22: 2 g via INTRAVENOUS
  Filled 2013-09-22 (×2): qty 50

## 2013-09-22 MED ORDER — CEFAZOLIN SODIUM-DEXTROSE 2-3 GM-% IV SOLR
INTRAVENOUS | Status: AC
  Filled 2013-09-22: qty 50

## 2013-09-22 MED ORDER — LACTATED RINGERS IV SOLN
INTRAVENOUS | Status: DC | PRN
  Administered 2013-09-22: 13:00:00 via INTRAVENOUS

## 2013-09-22 MED ORDER — LIDOCAINE HCL 2 % EX GEL
CUTANEOUS | Status: DC | PRN
  Administered 2013-09-22: 1 via URETHRAL

## 2013-09-22 MED ORDER — OXYBUTYNIN CHLORIDE 5 MG PO TABS
ORAL_TABLET | ORAL | Status: AC
  Administered 2013-09-22: 5 mg
  Filled 2013-09-22: qty 1

## 2013-09-22 MED ORDER — 0.9 % SODIUM CHLORIDE (POUR BTL) OPTIME
TOPICAL | Status: DC | PRN
  Administered 2013-09-22: 1000 mL

## 2013-09-22 MED ORDER — LACTATED RINGERS IV SOLN
INTRAVENOUS | Status: DC
  Administered 2013-09-22: 14:00:00 via INTRAVENOUS

## 2013-09-22 MED ORDER — HYDROMORPHONE HCL PF 1 MG/ML IJ SOLN: 0.2500 mg | INTRAMUSCULAR | Status: DC | PRN

## 2013-09-22 MED ORDER — PHENAZOPYRIDINE HCL 200 MG PO TABS
200.0000 mg | ORAL_TABLET | Freq: Once | ORAL | Status: AC | PRN
  Administered 2013-09-22: 200 mg via ORAL

## 2013-09-22 MED ORDER — FENTANYL CITRATE 0.05 MG/ML IJ SOLN
INTRAMUSCULAR | Status: AC
  Filled 2013-09-22: qty 2

## 2013-09-22 MED ORDER — FENTANYL CITRATE 0.05 MG/ML IJ SOLN
INTRAMUSCULAR | Status: DC | PRN
  Administered 2013-09-22 (×4): 25 ug via INTRAVENOUS

## 2013-09-22 MED ORDER — CEPHALEXIN 500 MG PO CAPS: 500.0000 mg | ORAL_CAPSULE | Freq: Three times a day (TID) | ORAL | Status: DC

## 2013-09-22 MED ORDER — BELLADONNA ALKALOIDS-OPIUM 16.2-60 MG RE SUPP
RECTAL | Status: AC
  Filled 2013-09-22: qty 1

## 2013-09-22 MED ORDER — OXYCODONE-ACETAMINOPHEN 5-325 MG PO TABS
ORAL_TABLET | ORAL | Status: AC
  Filled 2013-09-22: qty 2

## 2013-09-22 MED ORDER — LIDOCAINE HCL 2 % EX GEL
CUTANEOUS | Status: AC
  Filled 2013-09-22: qty 10

## 2013-09-22 MED ORDER — PROMETHAZINE HCL 25 MG/ML IJ SOLN: 6.2500 mg | INTRAMUSCULAR | Status: DC | PRN

## 2013-09-22 MED ORDER — SENNOSIDES-DOCUSATE SODIUM 8.6-50 MG PO TABS: 1.0000 | ORAL_TABLET | Freq: Two times a day (BID) | ORAL | Status: DC

## 2013-09-22 MED ORDER — IOHEXOL 300 MG/ML  SOLN
INTRAMUSCULAR | Status: DC | PRN
  Administered 2013-09-22: 9 mL

## 2013-09-22 MED ORDER — PROPOFOL 10 MG/ML IV BOLUS
INTRAVENOUS | Status: DC | PRN
  Administered 2013-09-22: 180 mg via INTRAVENOUS

## 2013-09-22 MED ORDER — MIDAZOLAM HCL 2 MG/2ML IJ SOLN
INTRAMUSCULAR | Status: AC
  Filled 2013-09-22: qty 2

## 2013-09-22 MED ORDER — ONDANSETRON HCL 4 MG/2ML IJ SOLN
INTRAMUSCULAR | Status: DC | PRN
  Administered 2013-09-22: 4 mg via INTRAVENOUS

## 2013-09-22 MED ORDER — PROPOFOL 10 MG/ML IV BOLUS
INTRAVENOUS | Status: AC
  Filled 2013-09-22: qty 20

## 2013-09-22 MED ORDER — OXYBUTYNIN CHLORIDE 5 MG PO TABS: 5.0000 mg | ORAL_TABLET | Freq: Four times a day (QID) | ORAL | Status: DC | PRN

## 2013-09-22 MED ORDER — MIDAZOLAM HCL 5 MG/5ML IJ SOLN
INTRAMUSCULAR | Status: DC | PRN
  Administered 2013-09-22: 2 mg via INTRAVENOUS

## 2013-09-22 MED ORDER — BELLADONNA ALKALOIDS-OPIUM 16.2-60 MG RE SUPP
RECTAL | Status: DC | PRN
  Administered 2013-09-22: 1 via RECTAL

## 2013-09-22 MED ORDER — KETOROLAC TROMETHAMINE 30 MG/ML IJ SOLN
30.0000 mg | Freq: Once | INTRAMUSCULAR | Status: AC
  Administered 2013-09-22: 30 mg via INTRAVENOUS
  Filled 2013-09-22: qty 1

## 2013-09-22 MED ORDER — LACTATED RINGERS IV SOLN: INTRAVENOUS | Status: DC

## 2013-09-22 MED ORDER — SODIUM CHLORIDE 0.9 % IR SOLN
Status: DC | PRN
  Administered 2013-09-22: 3000 mL

## 2013-09-22 MED ORDER — OXYCODONE-ACETAMINOPHEN 5-325 MG PO TABS: 1.0000 | ORAL_TABLET | ORAL | Status: DC | PRN

## 2013-09-22 MED ORDER — TAMSULOSIN HCL 0.4 MG PO CAPS: 0.4000 mg | ORAL_CAPSULE | Freq: Once | ORAL | Status: AC

## 2013-09-22 MED ORDER — LIDOCAINE HCL (CARDIAC) 20 MG/ML IV SOLN
INTRAVENOUS | Status: DC | PRN
Start: 2013-09-22 — End: 2013-09-22
  Administered 2013-09-22: 50 mg via INTRAVENOUS

## 2013-09-22 MED ORDER — PHENAZOPYRIDINE HCL 200 MG PO TABS
ORAL_TABLET | ORAL | Status: AC
  Filled 2013-09-22: qty 1

## 2013-09-22 MED ORDER — ONDANSETRON HCL 4 MG/2ML IJ SOLN
INTRAMUSCULAR | Status: AC
  Filled 2013-09-22: qty 4

## 2013-09-22 MED ORDER — PHENAZOPYRIDINE HCL 200 MG PO TABS: 200.0000 mg | ORAL_TABLET | Freq: Three times a day (TID) | ORAL | Status: DC | PRN

## 2013-09-22 MED ORDER — OXYCODONE-ACETAMINOPHEN 5-325 MG PO TABS
1.0000 | ORAL_TABLET | ORAL | Status: DC | PRN
  Administered 2013-09-22: 2 via ORAL

## 2013-09-22 SURGICAL SUPPLY — 11 items
BAG URO CATCHER STRL LF (DRAPE) ×2 IMPLANT
CATH URET 5FR 28IN OPEN ENDED (CATHETERS) IMPLANT
DRAPE CAMERA CLOSED 9X96 (DRAPES) ×2 IMPLANT
GLOVE BIOGEL M 7.0 STRL (GLOVE) ×2 IMPLANT
GOWN STRL REUS W/TWL XL LVL3 (GOWN DISPOSABLE) ×2 IMPLANT
GUIDEWIRE STR DUAL SENSOR (WIRE) ×2 IMPLANT
MANIFOLD NEPTUNE II (INSTRUMENTS) ×2 IMPLANT
PACK CYSTO (CUSTOM PROCEDURE TRAY) ×2 IMPLANT
SCRUB PCMX 4 OZ (MISCELLANEOUS) ×2 IMPLANT
STENT POLARIS 5FRX28 (STENTS) ×2 IMPLANT
TUBING CONNECTING 10 (TUBING) ×2 IMPLANT

## 2013-09-22 NOTE — ED Notes (Signed)
Patient states he does not have anything for security to lock up in safe-will take/keep belongings with him

## 2013-09-22 NOTE — ED Notes (Signed)
Pt arrived to the Ed with a complaint of a kidney stone.  Pt was here yesterday for same.  Pt states prescribed pain medications are ineffective.

## 2013-09-22 NOTE — Op Note (Signed)
Urology Operative Report  Date of Procedure: 09/22/13  Surgeon: Natalia Leatherwoodaniel Dravyn Severs, MD Assistant:  None  Preoperative Diagnosis: Left distal ureter stone. Postoperative Diagnosis: Left distal ureter stone. Left distal ureter stenosis.  Procedure(s): Cystoscopy. Left retrograde pyelogram with interpretation. Left ureter stent placement (5 x 28 polaris, no tether).  Estimated blood loss: None  Specimen: None  Drains: None  Complications: None  Findings: Left distal ureter stenosis.  History of present illness: 58 year old male presented to the ER twice with the last 3 days with left-sided flank pain due to a left ureter stone. He presented today and elected to proceed with possible left ureteroscopy and left ureter stent placement.   Procedure in detail: After informed consent was obtained, the patient was taken to the operating room. They were placed in the supine position. SCDs were turned on and in place. IV antibiotics were infused, and general anesthesia was induced. A timeout was performed in which the correct patient, surgical site, and procedure were identified and agreed upon by the team.  The patient was placed in a dorsolithotomy position, making sure to pad all pertinent neurovascular pressure points. The genitals were prepped and draped in the usual sterile fashion.  A rigid cystoscope was passed through the urethra and into the bladder. The bladder was drained and then fully distended. It was evaluated in a systematic fashion to visualize the entire surface of the bladder. This was negative for tumors. Attention was turned to the right ureter orifice. It was seen to effluxed clear urine. No urine was passing from the left ureter.  I obtained a left retrograde pyelogram by cannulating the left ureter orifice with a 5 French ureter catheter. It was noted that the left ureter orifice was very narrow. I then injected 8 cc of Omnipaque. There was noted to be significant  stenosis for approximately 3 cm before reaching a dilated ureter. There was mild hydronephrosis. There was a filling defect consistent with a left distal stone. This side did not drain well.  I then placed a sensor wire into the left or orifice. This had to be manipulated somewhat to pass into the more proximal ureter and kidney. The sensor wire took up most of the space of the ureter orifice. Because of the stenosis I did not fill be appropriate to perform ureteroscopy as I felt this would cause damage to the ureter. I elected instead to place a left ureter stent.  I placed a left ureter stent over the sensor wire using a 5 x 28 pole areas without tether. This was difficult to place to the area of stenosis. This was deployed with a good curl in the left renal pelvis and the loops within the bladder.  There is amber colored urine seen to be draining from the ureter stent. The bladder was then drained, cystoscope was removed, I placed 10 cc of lidocaine jelly into the urethra, and a B&O suppository into the rectum. Prostate was 30-35 g in size without nodules.  This completed the procedure. He was placed back in a supine position, anesthesia was reversed, and he was taken to the PACU in stable condition. He will followup with me as an outpatient to discuss further treatment. We have discussed risk and benefits of ureteroscopy and he would like to be scheduled for one of these.  All counts were correct at the end of the case.  Prescription for Keflex provided for the next 3 days.

## 2013-09-22 NOTE — ED Notes (Signed)
Patient transported to CT 

## 2013-09-22 NOTE — Anesthesia Preprocedure Evaluation (Addendum)
Anesthesia Evaluation  Patient identified by MRN, date of birth, ID band Patient awake    Reviewed: Allergy & Precautions, H&P , NPO status , Patient's Chart, lab work & pertinent test results  Airway Mallampati: II TM Distance: >3 FB Neck ROM: Full    Dental  (+) Poor Dentition, Dental Advisory Given   Pulmonary neg pulmonary ROS,  breath sounds clear to auscultation  Pulmonary exam normal       Cardiovascular negative cardio ROS  Rhythm:Regular Rate:Normal     Neuro/Psych negative neurological ROS  negative psych ROS   GI/Hepatic Neg liver ROS, GERD-  Medicated,  Endo/Other  Hypothyroidism   Renal/GU Renal disease  negative genitourinary   Musculoskeletal negative musculoskeletal ROS (+)   Abdominal   Peds  Hematology negative hematology ROS (+) anemia ,   Anesthesia Other Findings   Reproductive/Obstetrics                          Anesthesia Physical Anesthesia Plan  ASA: II and emergent  Anesthesia Plan: General   Post-op Pain Management:    Induction: Intravenous  Airway Management Planned: LMA  Additional Equipment:   Intra-op Plan:   Post-operative Plan: Extubation in OR  Informed Consent: I have reviewed the patients History and Physical, chart, labs and discussed the procedure including the risks, benefits and alternatives for the proposed anesthesia with the patient or authorized representative who has indicated his/her understanding and acceptance.   Dental advisory given  Plan Discussed with: CRNA  Anesthesia Plan Comments:         Anesthesia Quick Evaluation

## 2013-09-22 NOTE — ED Provider Notes (Signed)
Medical screening examination/treatment/procedure(s) were conducted as a shared visit with non-physician practitioner(s) and myself.  I personally evaluated the patient during the encounter.   EKG Interpretation None       Patient here with abdominal pain, hx of stones. Elevated creatinine, no UTI. Urology will stent  Jay HaitWilliam Tessi Eustache, MD 09/22/13 1058

## 2013-09-22 NOTE — ED Notes (Signed)
Pt encouraged to void when able. 

## 2013-09-22 NOTE — H&P (Signed)
Urology History and Physical Exam  CC: Left ureter stone  HPI: 58 year old male who presented to the ER 09/20/13 with severe left-sided flank pain. He was able to control his pain with medications and was discharged home to followup with urology. He returned to the ER today with return of severe left-sided flank pain. He had a CT scan which reveals a left distal 4 mm stone. This is associated with left-sided hydronephrosis. He has mild leukocytosis and his creatinine is elevated from 1.1 to 1.9. He has not had fever or tachycardia. His blood pressure is stable. Urine is negative for signs of infection with negative nitrites and negative leukocyte esterase. I reviewed the CT report and the images.  Today we discussed the management of urinary stones. These options include observation, ureteroscopy, shockwave lithotripsy, and PCNL. We discussed which options are relevant to these particular stones. We discussed the natural history of stones as well as the complications of untreated stones and the impact on quality of life without treatment as well as with each of the above listed treatments. We also discussed the efficacy of each treatment in its ability to clear the stone burden. With any of these management options I discussed the signs and symptoms of infection and the need for emergent treatment should these be experienced. For each option we discussed the ability of each procedure to clear the patient of their stone burden.  For observation I described the risks which include but are not limited to silent renal damage, life-threatening infection, need for emergent surgery, failure to pass stone, and pain.  For ureteroscopy I described the risks which include heart attack, stroke, pulmonary embolus, death, bleeding, infection, damage to contiguous structures, positioning injury, ureteral stricture, ureteral avulsion, ureteral injury, need for ureteral stent, inability to perform ureteroscopy, need for an  interval procedure, inability to clear stone burden, stent discomfort and pain. I explained that if I cannot safely perform ureteroscopy and remove the stone, that I would place a left ureter stent and return in interval fashion for ureteroscopy.  For shockwave lithotripsy I explained that that services not available today and therefore would not be an option unless he wanted to wait for treatment.   PMH: Past Medical History  Diagnosis Date  . Anemia   . Kidney stones   . Hypothyroid     PSH: Past Surgical History  Procedure Laterality Date  . Tonsillectomy      Allergies: Allergies  Allergen Reactions  . Hydrocodone Nausea And Vomiting    Medications:  (Not in a hospital admission)   Social History: History   Social History  . Marital Status: Married    Spouse Name: N/A    Number of Children: N/A  . Years of Education: N/A   Occupational History  . Not on file.   Social History Main Topics  . Smoking status: Never Smoker   . Smokeless tobacco: Not on file  . Alcohol Use: Yes  . Drug Use: No  . Sexual Activity: Not on file   Other Topics Concern  . Not on file   Social History Narrative  . No narrative on file    Family History: Family History  Problem Relation Age of Onset  . Adopted: Yes    Review of Systems: Positive: Nausea, left flank pain. Negative: Fever, SOB, or chest pain.  A further 10 point review of systems was negative except what is listed in the HPI.  Physical Exam: Filed Vitals:   09/22/13 0913  BP: 111/68  Pulse: 68  Temp: 98.4 F (36.9 C)  Resp: 16    General: No acute distress.  Awake. Head:  Normocephalic.  Atraumatic. ENT:  EOMI.  Mucous membranes moist Neck:  Supple.  No lymphadenopathy. CV:  S1 present. S2 present. Regular rate. Pulmonary: Equal effort bilaterally.  Clear to auscultation bilaterally. Abdomen: Soft.  Non- tender to palpation. Skin:  Normal turgor.  No visible rash. Extremity: No gross deformity  of bilateral upper extremities.  No gross deformity of    bilateral lower extremities. Neurologic: Alert. Appropriate mood.    Studies:  Recent Labs     09/21/13  0055  09/22/13  0650  HGB  11.8*  12.0*  WBC  7.9  14.6*  PLT  272  262    Recent Labs     09/21/13  0055  09/22/13  0650  NA  140  139  K  3.8  4.1  CL  103  100  CO2  21  22  BUN  18  22  CREATININE  1.25  1.92*  CALCIUM  9.4  9.4  GFRNONAA  63*  37*  GFRAA  73*  43*     No results found for this basename: PT, INR, APTT,  in the last 72 hours   No components found with this basename: ABG,     Assessment:  Left ureter stone.  Plan: He wishes to proceed to the operating room for cystoscopy, left retrograde pyelogram, left ureteroscopy with laser lithotripsy, and left ureter stent placement.  Given the fact that this is his second trip to the ER in the past 3 days I feel that intervention is appropriate.  IV Ancef on call to the OR. Informed consent obtained. Correct surgical site marked.

## 2013-09-22 NOTE — Progress Notes (Signed)
Janyth PupaFriend, Teresa, here. She has gone to retreive valuables from ER for patient. [employer](Wallet, money and cc)

## 2013-09-22 NOTE — Transfer of Care (Signed)
Immediate Anesthesia Transfer of Care Note  Patient: Jay Mercer  Procedure(s) Performed: Procedure(s) (LRB): CYSTOSCOPY WITH RETROGRADE PYELOGRAM/URETERAL STENT PLACEMENT (Left)  Patient Location: PACU  Anesthesia Type: General  Level of Consciousness: sedated, patient cooperative and responds to stimulation  Airway & Oxygen Therapy: Patient Spontanous Breathing and Patient connected to face mask oxgen  Post-op Assessment: Report given to PACU RN and Post -op Vital signs reviewed and stable  Post vital signs: Reviewed and stable  Complications: No apparent anesthesia complications

## 2013-09-22 NOTE — ED Notes (Signed)
Pt resting at present time. Pt states "I feel good."

## 2013-09-22 NOTE — Anesthesia Postprocedure Evaluation (Signed)
Anesthesia Post Note  Patient: [facility]  Procedure(s) Performed: Procedure(s) (LRB): CYSTOSCOPY WITH RETROGRADE PYELOGRAM/URETERAL STENT PLACEMENT (Left)  Anesthesia type: General  Patient location: PACU  Post pain: Pain level controlled  Post assessment: Post-op Vital signs reviewed  Last Vitals:  Filed Vitals:   09/22/13 1430  BP: 102/73  Pulse: 63  Temp: 36.6 C  Resp: 10    Post vital signs: Reviewed  Level of consciousness: sedated  Complications: No apparent anesthesia complications

## 2013-09-22 NOTE — ED Notes (Signed)
Pt reports increase in pain 5/10 to left flank. Gwendolyn GrantWalden MD notified and medication administered. See MAR.

## 2013-09-22 NOTE — ED Provider Notes (Signed)
CSN: [id number]     Arrival date & time 09/22/13  0553 History   First MD Initiated Contact with Patient 09/22/13 0617     Chief Complaint  Patient presents with  . Nephrolithiasis     (Consider location/radiation/quality/duration/timing/severity/associated sxs/prior Treatment) HPI Comments: Patient presents to the ED with a chief complaint of left flank pain.  He states that he was seen here yesterday for the same.  He states that he was sent home with some pain medication, but it has not been working.  He reports 10/10 pain.  He is unable to find a comfortable position.  He has a history of kidneys stones, and has required lithotripsy once.  He denies fevers, chills, or dysuria.  He doesn't routinely see a urologist.  The history is provided by the patient. No language interpreter was used.    Past Medical History  Diagnosis Date  . Anemia   . Kidney stones   . Hypothyroid    Past Surgical History  Procedure Laterality Date  . Tonsillectomy     Family History  Problem Relation Age of Onset  . Adopted: Yes   History  Substance Use Topics  . Smoking status: Never Smoker   . Smokeless tobacco: Not on file  . Alcohol Use: Yes    Review of Systems  Constitutional: Negative for fever and chills.  Respiratory: Negative for shortness of breath.   Cardiovascular: Negative for chest pain.  Gastrointestinal: Negative for nausea, vomiting, diarrhea and constipation.  Genitourinary: Positive for flank pain. Negative for dysuria.      Allergies  Hydrocodone  Home Medications   Current Outpatient Rx  Name  Route  Sig  Dispense  Refill  . levothyroxine (SYNTHROID, LEVOTHROID) 75 MCG tablet   Oral   Take 75 mcg by mouth daily before breakfast.         . pantoprazole (PROTONIX) 40 MG tablet   Oral   Take 40 mg by mouth daily.         . tamsulosin (FLOMAX) 0.4 MG CAPS capsule   Oral   Take 1 capsule (0.4 mg total) by mouth once.   10 capsule   0   .  HYDROcodone-acetaminophen (NORCO/VICODIN) 5-325 MG per tablet   Oral   Take 1 tablet by mouth every 6 (six) hours as needed.   15 tablet   0   . ibuprofen (ADVIL,MOTRIN) 600 MG tablet   Oral   Take 1 tablet (600 mg total) by mouth every 6 (six) hours as needed.   30 tablet   0   . ondansetron (ZOFRAN-ODT) 8 MG disintegrating tablet   Oral   Take 1 tablet (8 mg total) by mouth every 8 (eight) hours as needed for nausea or vomiting.   15 tablet   0    BP 140/82  Pulse 101  Temp(Src) 98.6 F (37 C) (Oral)  Resp 16  SpO2 98% Physical Exam  Nursing note and vitals reviewed. Constitutional: He is oriented to person, place, and time. He appears well-developed and well-nourished.  Visibly uncomfortable, holding left flank, rocking back and forth  HENT:  Head: Normocephalic and atraumatic.  Eyes: Conjunctivae and EOM are normal. Pupils are equal, round, and reactive to light. Right eye exhibits no discharge. Left eye exhibits no discharge. No scleral icterus.  Neck: Normal range of motion. Neck supple. No JVD present.  Cardiovascular: Normal rate, regular rhythm and normal heart sounds.  Exam reveals no gallop and no friction rub.   No  murmur heard. Pulmonary/Chest: Effort normal and breath sounds normal. No respiratory distress. He has no wheezes. He has no rales. He exhibits no tenderness.  Abdominal: Soft. He exhibits no distension and no mass. There is no tenderness. There is no rebound and no guarding.  No focal abdominal tenderness, no RLQ tenderness or pain at McBurney's point, no RUQ tenderness or Murphy's sign, no left-sided abdominal tenderness, no fluid wave, or signs of peritonitis  Left flank pain with CVA tenderness  Musculoskeletal: Normal range of motion. He exhibits no edema and no tenderness.  Neurological: He is alert and oriented to person, place, and time.  Skin: Skin is warm and dry.  Psychiatric: He has a normal mood and affect. His behavior is normal.  Judgment and thought content normal.    ED Course  Procedures (including critical care time) Labs Review Labs Reviewed - No data to display Imaging Review US Renal  09/21/2013   CLINICAL DATA:  Left flank pain  EXAM: RENAL/URINARY TRACT ULTRASOUND COMPLETE  COMPARISON:  None.  FINDINGS: Right Kidney:  Length: 12 cm. Echogenicity within normal limits. No mass or hydronephrosis visualized.  Left Kidney:  Length: 13 cm. Mild pelvicaliectasis. No evidence of mass. Normal cortical echogenicity.  Bladder:  Nondilated bladder. Ureteral jets were not captured bilaterally. Enlarged prostate with echogenic foci centrally consistent with dystrophic calcification. The prostate measures up to 5 cm in diameter, and deforms the bladder base.  Echogenic liver, consistent with steatosis.  IMPRESSION: 1. Left pelvicaliectasis without overt hydronephrosis. 2. Enlarged prostate. 3. Hepatic steatosis.   Electronically Signed   By: Tiburcio Pea M.D.   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  Recent US shows no hydronephrosis, but with patient's worsening symptoms, as well as the fact that he has required intervention in the past, I will order a CT abd.  Will treat patient's pain, and reassess.  He is visibly uncomfortable.  10:28 AM Patient's pain is controlled. I discussed patient with Dr. Gwendolyn Grant, who recommends consulting urology based on the patient's bump in creatinine. Discussed patient with Dr. Margarita Grizzle, who states that he can take the patient to the OR today to place a stent. Patient agrees with this plan, and would like to proceed with the surgery.  Last oral intake was Thursday evening despite drinking some water and some sunkist soda.    Roxy Horseman, PA-C 09/22/13 1030

## 2013-09-22 NOTE — Discharge Instructions (Signed)
DISCHARGE INSTRUCTIONS FOR KIDNEY STONES OR URETERAL STENT  MEDICATIONS:   1.  Resume all your other meds from home.  ACTIVITY 1. No strenuous activity x 1week 2. No driving while on narcotic pain medications 3. Drink plenty of water 4. Continue to walk at home - you can still get blood clots when you are at home, so keep active, but don't over do it. 5. May return to work in 3 days.  BATHING 1. You can shower or bath.    SIGNS/SYMPTOMS TO CALL: 1. Please call us if you have a fever greater than 101.5, uncontrolled  nausea/vomiting, uncontrolled pain, dizziness, unable to urinate, chest pain, shortness of breath, leg swelling, leg pain, redness around wound, drainage from wound, or any other concerns or questions.  You can reach us at [phone].  FOLLOW-UP 1. Call clinic to schedule an outpatient appointment. I will work on getting you scheduled for surgery.

## 2013-09-22 NOTE — ED Notes (Signed)
Pt resting and reports decrease in pain 2/10.

## 2013-09-23 LAB — URINE CULTURE
COLONY COUNT: NO GROWTH
Culture: NO GROWTH

## 2013-09-24 ENCOUNTER — Encounter (HOSPITAL_COMMUNITY): Payer: Self-pay | Admitting: Urology

## 2013-09-24 ENCOUNTER — Other Ambulatory Visit: Payer: Self-pay | Admitting: Urology

## 2013-10-08 ENCOUNTER — Encounter (HOSPITAL_BASED_OUTPATIENT_CLINIC_OR_DEPARTMENT_OTHER): Payer: Self-pay | Admitting: *Deleted

## 2013-10-08 NOTE — Progress Notes (Signed)
NPO AFTER MN WITH EXCEPTION CLEAR LIQUIDS UNTIL 0700.  ARRIVE AT 1215.  CURRENT LAB RESULTS IN CHART AND EPIC.  WILL TAKE SYNTHROID AND PROTONIX AM DOS W/ SIPS OF WATER AND IF NEEDED TAKE PERCOCET.

## 2013-10-10 ENCOUNTER — Ambulatory Visit (HOSPITAL_BASED_OUTPATIENT_CLINIC_OR_DEPARTMENT_OTHER)
Admission: RE | Admit: 2013-10-10 | Discharge: 2013-10-10 | Disposition: A | Discharge status: Home / Self Care | Payer: 59 | Source: Ambulatory Visit | Attending: Urology | Admitting: Urology

## 2013-10-10 ENCOUNTER — Ambulatory Visit (HOSPITAL_BASED_OUTPATIENT_CLINIC_OR_DEPARTMENT_OTHER): Payer: 59 | Admitting: Anesthesiology

## 2013-10-10 ENCOUNTER — Encounter (HOSPITAL_BASED_OUTPATIENT_CLINIC_OR_DEPARTMENT_OTHER): Payer: Self-pay | Admitting: *Deleted

## 2013-10-10 ENCOUNTER — Encounter (HOSPITAL_BASED_OUTPATIENT_CLINIC_OR_DEPARTMENT_OTHER): Payer: 59 | Admitting: Anesthesiology

## 2013-10-10 ENCOUNTER — Encounter (HOSPITAL_BASED_OUTPATIENT_CLINIC_OR_DEPARTMENT_OTHER)
Admission: RE | Disposition: A | Discharge status: Home / Self Care | Payer: Self-pay | Source: Ambulatory Visit | Attending: Urology

## 2013-10-10 DIAGNOSIS — J42 Unspecified chronic bronchitis: Secondary | ICD-10-CM | POA: Insufficient documentation

## 2013-10-10 DIAGNOSIS — M129 Arthropathy, unspecified: Secondary | ICD-10-CM | POA: Insufficient documentation

## 2013-10-10 DIAGNOSIS — N135 Crossing vessel and stricture of ureter without hydronephrosis: Secondary | ICD-10-CM | POA: Insufficient documentation

## 2013-10-10 DIAGNOSIS — E039 Hypothyroidism, unspecified: Secondary | ICD-10-CM | POA: Insufficient documentation

## 2013-10-10 DIAGNOSIS — Z886 Allergy status to analgesic agent status: Secondary | ICD-10-CM | POA: Insufficient documentation

## 2013-10-10 DIAGNOSIS — N200 Calculus of kidney: Secondary | ICD-10-CM

## 2013-10-10 DIAGNOSIS — Z87442 Personal history of urinary calculi: Secondary | ICD-10-CM | POA: Insufficient documentation

## 2013-10-10 DIAGNOSIS — M109 Gout, unspecified: Secondary | ICD-10-CM | POA: Insufficient documentation

## 2013-10-10 DIAGNOSIS — Z87891 Personal history of nicotine dependence: Secondary | ICD-10-CM | POA: Insufficient documentation

## 2013-10-10 DIAGNOSIS — N201 Calculus of ureter: Secondary | ICD-10-CM | POA: Insufficient documentation

## 2013-10-10 HISTORY — DX: Calculus of ureter: N20.1

## 2013-10-10 HISTORY — DX: Crossing vessel and stricture of ureter without hydronephrosis: N13.5

## 2013-10-10 HISTORY — DX: Personal history of other diseases of the digestive system: Z87.19

## 2013-10-10 HISTORY — DX: Personal history of other diseases of the respiratory system: Z87.09

## 2013-10-10 HISTORY — DX: Hypothyroidism, unspecified: E03.9

## 2013-10-10 HISTORY — DX: Presence of spectacles and contact lenses: Z97.3

## 2013-10-10 HISTORY — DX: Urgency of urination: R39.15

## 2013-10-10 HISTORY — DX: Personal history of urinary calculi: Z87.442

## 2013-10-10 HISTORY — DX: Frequency of micturition: R35.0

## 2013-10-10 HISTORY — DX: Unspecified osteoarthritis, unspecified site: M19.90

## 2013-10-10 HISTORY — PX: CYSTOSCOPY WITH RETROGRADE PYELOGRAM, URETEROSCOPY AND STENT PLACEMENT: SHX5789

## 2013-10-10 HISTORY — DX: Gout, unspecified: M10.9

## 2013-10-10 SURGERY — CYSTOURETEROSCOPY, WITH RETROGRADE PYELOGRAM AND STENT INSERTION
Anesthesia: General | Site: Ureter | Laterality: Left

## 2013-10-10 MED ORDER — OXYCODONE-ACETAMINOPHEN 5-325 MG PO TABS: 1.0000 | ORAL_TABLET | ORAL | Status: DC | PRN

## 2013-10-10 MED ORDER — MIDAZOLAM HCL 5 MG/5ML IJ SOLN
INTRAMUSCULAR | Status: DC | PRN
  Administered 2013-10-10: 2 mg via INTRAVENOUS

## 2013-10-10 MED ORDER — FENTANYL CITRATE 0.05 MG/ML IJ SOLN
INTRAMUSCULAR | Status: AC
  Filled 2013-10-10: qty 2

## 2013-10-10 MED ORDER — CEPHALEXIN 500 MG PO CAPS: 500.0000 mg | ORAL_CAPSULE | Freq: Three times a day (TID) | ORAL | Status: DC

## 2013-10-10 MED ORDER — BELLADONNA ALKALOIDS-OPIUM 16.2-60 MG RE SUPP
RECTAL | Status: DC | PRN
  Administered 2013-10-10: 1 via RECTAL

## 2013-10-10 MED ORDER — ONDANSETRON HCL 4 MG/2ML IJ SOLN
INTRAMUSCULAR | Status: DC | PRN
  Administered 2013-10-10: 4 mg via INTRAVENOUS

## 2013-10-10 MED ORDER — MIDAZOLAM HCL 2 MG/2ML IJ SOLN
INTRAMUSCULAR | Status: AC
  Filled 2013-10-10: qty 2

## 2013-10-10 MED ORDER — PROMETHAZINE HCL 25 MG/ML IJ SOLN
6.2500 mg | INTRAMUSCULAR | Status: DC | PRN
  Filled 2013-10-10: qty 1

## 2013-10-10 MED ORDER — CEFAZOLIN SODIUM-DEXTROSE 2-3 GM-% IV SOLR
2.0000 g | INTRAVENOUS | Status: AC
  Administered 2013-10-10: 2 g via INTRAVENOUS
  Filled 2013-10-10: qty 50

## 2013-10-10 MED ORDER — LACTATED RINGERS IV SOLN
INTRAVENOUS | Status: DC
  Filled 2013-10-10: qty 1000

## 2013-10-10 MED ORDER — LIDOCAINE HCL 1 % IJ SOLN
INTRAMUSCULAR | Status: DC | PRN
  Administered 2013-10-10: 50 mg via INTRADERMAL

## 2013-10-10 MED ORDER — SUCCINYLCHOLINE CHLORIDE 20 MG/ML IJ SOLN
INTRAMUSCULAR | Status: DC | PRN
  Administered 2013-10-10: 30 mg via INTRAVENOUS

## 2013-10-10 MED ORDER — LACTATED RINGERS IV SOLN
INTRAVENOUS | Status: DC
  Administered 2013-10-10 (×2): via INTRAVENOUS
  Filled 2013-10-10: qty 1000

## 2013-10-10 MED ORDER — PROPOFOL 10 MG/ML IV BOLUS
INTRAVENOUS | Status: DC | PRN
  Administered 2013-10-10: 200 mg via INTRAVENOUS

## 2013-10-10 MED ORDER — LIDOCAINE HCL 2 % EX GEL
CUTANEOUS | Status: DC | PRN
  Administered 2013-10-10: 1 via URETHRAL

## 2013-10-10 MED ORDER — OXYCODONE-ACETAMINOPHEN 5-325 MG PO TABS
ORAL_TABLET | ORAL | Status: AC
  Filled 2013-10-10: qty 1

## 2013-10-10 MED ORDER — FENTANYL CITRATE 0.05 MG/ML IJ SOLN
INTRAMUSCULAR | Status: AC
  Filled 2013-10-10: qty 4

## 2013-10-10 MED ORDER — FENTANYL CITRATE 0.05 MG/ML IJ SOLN
25.0000 ug | INTRAMUSCULAR | Status: DC | PRN
  Administered 2013-10-10: 25 ug via INTRAVENOUS
  Filled 2013-10-10: qty 1

## 2013-10-10 MED ORDER — FENTANYL CITRATE 0.05 MG/ML IJ SOLN
INTRAMUSCULAR | Status: DC | PRN
  Administered 2013-10-10: 100 ug via INTRAVENOUS

## 2013-10-10 MED ORDER — MEPERIDINE HCL 25 MG/ML IJ SOLN
6.2500 mg | INTRAMUSCULAR | Status: DC | PRN
  Filled 2013-10-10: qty 1

## 2013-10-10 MED ORDER — SODIUM CHLORIDE 0.9 % IR SOLN
Status: DC | PRN
  Administered 2013-10-10: 6000 mL

## 2013-10-10 MED ORDER — BELLADONNA ALKALOIDS-OPIUM 16.2-60 MG RE SUPP
RECTAL | Status: AC
  Filled 2013-10-10: qty 1

## 2013-10-10 MED ORDER — OXYCODONE-ACETAMINOPHEN 5-325 MG PO TABS
1.0000 | ORAL_TABLET | ORAL | Status: AC | PRN
  Administered 2013-10-10: 1 via ORAL
  Filled 2013-10-10: qty 1

## 2013-10-10 MED ORDER — IOHEXOL 350 MG/ML SOLN
INTRAVENOUS | Status: DC | PRN
  Administered 2013-10-10: 10 mL

## 2013-10-10 SURGICAL SUPPLY — 41 items
BAG DRAIN URO-CYSTO SKYTR STRL (DRAIN) ×3 IMPLANT
BASKET LASER NITINOL 1.9FR (BASKET) IMPLANT
BASKET STNLS GEMINI 4WIRE 3FR (BASKET) IMPLANT
BASKET ZERO TIP NITINOL 2.4FR (BASKET) IMPLANT
CANISTER SUCT LVC 12 LTR MEDI- (MISCELLANEOUS) ×3 IMPLANT
CATH CLEAR GEL 3F BACKSTOP (CATHETERS) IMPLANT
CATH INTERMIT  6FR 70CM (CATHETERS) IMPLANT
CATH URET 5FR 28IN CONE TIP (BALLOONS)
CATH URET 5FR 28IN OPEN ENDED (CATHETERS) ×3 IMPLANT
CATH URET 5FR 70CM CONE TIP (BALLOONS) IMPLANT
CATH URET DUAL LUMEN 6-10FR 50 (CATHETERS) IMPLANT
CLOTH BEACON ORANGE TIMEOUT ST (SAFETY) ×3 IMPLANT
DRAPE CAMERA CLOSED 9X96 (DRAPES) ×3 IMPLANT
ELECT REM PT RETURN 9FT ADLT (ELECTROSURGICAL)
ELECTRODE REM PT RTRN 9FT ADLT (ELECTROSURGICAL) IMPLANT
FIBER LASER FLEXIVA 200 (UROLOGICAL SUPPLIES) IMPLANT
FIBER LASER FLEXIVA 365 (UROLOGICAL SUPPLIES) IMPLANT
GLOVE BIO SURGEON STRL SZ 6 (GLOVE) ×3 IMPLANT
GLOVE BIO SURGEON STRL SZ7 (GLOVE) ×3 IMPLANT
GLOVE ECLIPSE 7.0 STRL STRAW (GLOVE) ×3 IMPLANT
GLOVE INDICATOR 7.0 STRL GRN (GLOVE) ×6 IMPLANT
GLOVE INDICATOR 7.5 STRL GRN (GLOVE) ×3 IMPLANT
GOWN PREVENTION PLUS LG XLONG (DISPOSABLE) IMPLANT
GOWN STRL REUS W/ TWL LRG LVL3 (GOWN DISPOSABLE) ×4 IMPLANT
GOWN STRL REUS W/TWL LRG LVL3 (GOWN DISPOSABLE) ×2
GOWN STRL REUS W/TWL XL LVL3 (GOWN DISPOSABLE) ×6 IMPLANT
GUIDEWIRE 0.038 PTFE COATED (WIRE) IMPLANT
GUIDEWIRE ANG ZIPWIRE 038X150 (WIRE) IMPLANT
GUIDEWIRE STR DUAL SENSOR (WIRE) ×6 IMPLANT
IV NS IRRIG 3000ML ARTHROMATIC (IV SOLUTION) ×6 IMPLANT
KIT BALLIN UROMAX 15FX10 (LABEL) IMPLANT
KIT BALLN UROMAX 15FX4 (MISCELLANEOUS) IMPLANT
KIT BALLN UROMAX 26 75X4 (MISCELLANEOUS)
PACK CYSTOSCOPY (CUSTOM PROCEDURE TRAY) ×3 IMPLANT
SET HIGH PRES BAL DIL (LABEL)
SHEATH ACCESS URETERAL 38CM (SHEATH) IMPLANT
SHEATH ACCESS URETERAL 54CM (SHEATH) IMPLANT
SHEATH URET ACCESS 12FR/35CM (UROLOGICAL SUPPLIES) IMPLANT
SHEATH URET ACCESS 12FR/55CM (UROLOGICAL SUPPLIES) IMPLANT
STENT POLARIS LOOP 6FR X 26 CM (STENTS) ×3 IMPLANT
SYRINGE IRR TOOMEY STRL 70CC (SYRINGE) IMPLANT

## 2013-10-10 NOTE — Transfer of Care (Signed)
Immediate Anesthesia Transfer of Care Note  Patient: Jay Mercer  Procedure(s) Performed: Procedure(s): CYSTOSCOPY WITH RETROGRADE PYELOGRAM, URETEROSCOPY AND STENT EXCHANGE (Left) HOLMIUM LASER APPLICATION (Left)  Patient Location: PACU  Anesthesia Type:General  Level of Consciousness: awake, alert  and oriented  Airway & Oxygen Therapy: Patient Spontanous Breathing and Patient connected to face mask oxygen  Post-op Assessment: Report given to PACU RN and Post -op Vital signs reviewed and stable  Post vital signs: Reviewed and stable  Complications: No apparent anesthesia complications

## 2013-10-10 NOTE — Anesthesia Preprocedure Evaluation (Signed)
Anesthesia Evaluation  Patient identified by MRN, date of birth, ID band Patient awake    Reviewed: Allergy & Precautions, H&P , NPO status , Patient's Chart, lab work & pertinent test results  Airway Mallampati: II TM Distance: >3 FB Neck ROM: Full    Dental  (+) Poor Dentition, Dental Advisory Given   Pulmonary neg pulmonary ROS, former smoker,  breath sounds clear to auscultation  Pulmonary exam normal       Cardiovascular negative cardio ROS  Rhythm:Regular Rate:Normal     Neuro/Psych negative neurological ROS  negative psych ROS   GI/Hepatic Neg liver ROS, GERD-  Medicated and Controlled,  Endo/Other  Hypothyroidism   Renal/GU Renal disease  negative genitourinary   Musculoskeletal negative musculoskeletal ROS (+)   Abdominal   Peds  Hematology negative hematology ROS (+) anemia ,   Anesthesia Other Findings   Reproductive/Obstetrics                           Anesthesia Physical  Anesthesia Plan  ASA: II  Anesthesia Plan: General   Post-op Pain Management:    Induction: Intravenous  Airway Management Planned: LMA  Additional Equipment:   Intra-op Plan:   Post-operative Plan: Extubation in OR  Informed Consent: I have reviewed the patients History and Physical, chart, labs and discussed the procedure including the risks, benefits and alternatives for the proposed anesthesia with the patient or authorized representative who has indicated his/her understanding and acceptance.   Dental advisory given  Plan Discussed with: CRNA  Anesthesia Plan Comments:         Anesthesia Quick Evaluation

## 2013-10-10 NOTE — Op Note (Signed)
Urology Operative Report  Date of Procedure: 10/10/13  Surgeon: Natalia Leatherwoodaniel Bosco Paparella, MD Assistant:  None  Preoperative Diagnosis: Left distal ureter stone. Left nephrolithiasis. Postoperative Diagnosis: Left nephrolithiasis.  Procedure(s): Left ureteroscopy with left renal stone removal.  Left ureter stent placement (6 x 26 polaris, no tether). Left retrograde pyelogram with interpretation. Left ureter stent removal. Cystoscopy.  Estimated blood loss: None  Specimen: Left renal stone (Sent to [facility] lab).  Drains: None.  Complications: None  Findings: Left distal ureter stenosis. Negative left distal ureter stone. Punctate left renal stone.  History of present illness: 58 year old oh presents today for left distal ureter stone. He has left ureter stent placed earlier this month for intractable pain due to his left distal ureter stone. He presents today for interval ureteroscopy.   Procedure in detail: After informed consent was obtained, the patient was taken to the operating room. They were placed in the supine position. SCDs were turned on and in place. IV antibiotics were infused, and general anesthesia was induced. A timeout was performed in which the correct patient, surgical site, and procedure were identified and agreed upon by the team.  The patient was placed in a dorsolithotomy position, making sure to pad all pertinent neurovascular pressure points. A belladonna and opium suppository was placed into the rectum. The genitals were prepped and draped in the usual sterile fashion.  A rigid cystoscope was advanced through the urethra and into the bladder. Attention was turned to the left ureter orifice. It was grasped with a 0 tip Nitinol basket and pulled to the urethra meatus. A sensor wire was placed through stent and into the left renal pelvis on fluoroscopy. This was secured as a safety wire.  Left retrograde pyelogram was obtained by cannulating the left ureter orifice with  a 5 French ureter catheter and I injected 10 cc of Omnipaque. This was negative for filling defects. There was some stenosis of the left distal ureter but no hydronephrosis.  At this point I felt that ureteroscopy was the most logical next a to ensure that he did not have a stone in his ureter. He was paralyzed and a semirigid uteroscope was passed through the urethra into the bladder and in the left distal ureter. This was passed into the left mid ureter. This was negative for stone. I was concerned that stent placement or retrograde program to push the stone up into the kidney. I withdrew the ureter scope.  I placed a second sensor wire into the left renal pelvis. I then attempted to place a flexible digital scope over the wire, but this could not be placed due to stenosis of the left distal ureter. I therefore which are the ureter scope. I placed the obturator to 12/14 ureter access sheath over the wire under fluoroscopy with ease into the left distal ureter. I then placed the operator and access sheath over the wire with ease into the left distal ureter. The obturator and working wire were removed. I then navigated the digital ureter scope into the access sheath and into the distal ureter. I did encounter several areas of narrowing in the ureter but was able to navigate the ureter scope into the left kidney.  I evaluated the left kidney in a systematic fashion to visualize all of the calyces. I noted the punctate stone seen on CT scan but no other stones. This was grasped with a basket and withdrawn. I then replaced the ureteroscope into the distal ureter withdrew the access sheath. There was no  injury to the mucosa but there was inflammation.  I elected to place a stent due to the use of an access sheath. I loaded the safety wire through the cystoscope and placed a 6 x 26 Polaris stent without a tether over the wire, through the cystoscope, under fluoroscopy with ease. It was deployed with a curl in the  left renal pelvis and the loops within the bladder.  The bladder was drained. I withdrew the cystoscope, and placed 10 cc of lidocaine jelly into the urethra. He's placed back in a supine position, anesthesia was reversed, and [facility]  All counts were correct at the end of the case.  He will be given Keflex to cover for stent removal to be performed next week.

## 2013-10-10 NOTE — Discharge Instructions (Signed)
DISCHARGE INSTRUCTIONS FOR KIDNEY STONES OR URETERAL STENT ° °MEDICATIONS:  ° °1. Resume all your other meds from home. ° °ACTIVITY °1. No strenuous activity x 1week °2. No driving while on narcotic pain medications °3. Drink plenty of water °4. Continue to walk at home - you can still get blood clots when you are at home, so keep active, but don't over do it. °5. May return to work in 3 days. ° °BATHING °1. You can shower or bath. ° ° ° °SIGNS/SYMPTOMS TO CALL: °1. Please call us if you have a fever greater than 101.5, uncontrolled  °nausea/vomiting, uncontrolled pain, dizziness, unable to urinate, chest pain, shortness of breath, leg swelling, leg pain, redness around wound, drainage from wound, or any other concerns or questions. ° °You can reach us at [phone]. ° ° °Post Anesthesia Home Care Instructions ° °Activity: °Get plenty of rest for the remainder of the day. A responsible adult should stay with you for 24 hours following the procedure.  °For the next 24 hours, DO NOT: °-Drive a car °-Operate machinery °-Drink alcoholic beverages °-Take any medication unless instructed by your physician °-Make any legal decisions or sign important papers. ° °Meals: °Start with liquid foods such as gelatin or soup. Progress to regular foods as tolerated. Avoid greasy, spicy, heavy foods. If nausea and/or vomiting occur, drink only clear liquids until the nausea and/or vomiting subsides. Call your physician if vomiting continues. ° °Special Instructions/Symptoms: °Your throat may feel dry or sore from the anesthesia or the breathing tube placed in your throat during surgery. If this causes discomfort, gargle with warm salt water. The discomfort should disappear within 24 hours. ° ° ° °

## 2013-10-10 NOTE — H&P (Signed)
Urology History and Physical Exam  CC: Left ureter stone.  HPI:  58 year old male presents today for left ureter stone.  He presented to the ER 09/20/13 with left-sided flank pain.  CT scan revealed a distal left 4 mm stone.  There appeared to be a possible punctate stone in the right kidney and left kidney; these were not obstructing. The left ureter stone was associated with left hydronephrosis.  He also had an elevated creatinine.  He returned to the ER 2 days later with severe flank pain.  There was difficulty controlling his pain.  His of this he had a left ureter stent placed on 09/22/13.  We discussed definitive management options and he elected to proceed with interval ureteroscopy.  He presents today for left ureteroscopy, cystoscopy, laser lithotripsy, left retrograde Ogren, and left ureter stent exchange.  We have discussed the risks and benefits, alternatives, and liquid of achieving goals.  Urine culture from the hospital 09/22/13 was negative for growth.  Due to the small size of the stone in the left kidney and do not recommend ureteroscopy into the left kidney for removal of the stone as his problematic stone is distal.  PMH: Past Medical History  Diagnosis Date  . Anemia   . Left ureteral calculus   . Ureteral stenosis, left   . Hypothyroidism   . H/O hiatal hernia   . Urgency of urination   . Frequency of urination   . History of kidney stones   . History of chronic bronchitis   . Wears glasses   . Arthritis   . Gout     PSH: Past Surgical History  Procedure Laterality Date  . Tonsillectomy    . Cystoscopy w/ ureteral stent placement Left 09/22/2013    Procedure: CYSTOSCOPY WITH RETROGRADE PYELOGRAM/URETERAL STENT PLACEMENT;  Surgeon: Milford Cageaniel Young Edoardo Laforte, MD;  Location: [facility];  Service: Urology;  Laterality: Left;  . Extracorporeal shock wave lithotripsy  2013    Allergies: Allergies  Allergen Reactions  . Hydrocodone Nausea And Vomiting    Medications: No  prescriptions prior to admission     Social History: History   Social History  . Marital Status: Divorced    Spouse Name: N/A    Number of Children: N/A  . Years of Education: N/A   Occupational History  . Not on file.   Social History Main Topics  . Smoking status: Former Smoker -- 1.50 packs/day for 23 years    Types: Cigarettes    Quit date: 10/08/1993  . Smokeless tobacco: Former NeurosurgeonUser    Types: Chew    Quit date: 10/08/1988  . Alcohol Use: No  . Drug Use: No  . Sexual Activity: Not on file   Other Topics Concern  . Not on file   Social History Narrative  . No narrative on file    Family History: Family History  Problem Relation Age of Onset  . Adopted: Yes    Review of Systems: Positive: Frequency. Negative: Chest pain, SOB, or fever.  A further 10 point review of systems was negative except what is listed in the HPI.  Physical Exam: Filed Vitals:   10/10/13 1206  BP: 136/88  Pulse: 76  Temp: 97.3 F (36.3 C)  Resp: 18    General: No acute distress.  Awake. Head:  Normocephalic.  Atraumatic. ENT:  EOMI.  Mucous membranes moist Neck:  Supple.  No lymphadenopathy. CV:  S1 present. S2 present. Regular rate. Pulmonary: Equal effort bilaterally.  Clear to auscultation  bilaterally. Abdomen: Soft.  Non- tender to palpation. Skin:  Normal turgor.  No visible rash. Extremity: No gross deformity of bilateral upper extremities.  No gross deformity of    bilateral lower extremities. Neurologic: Alert. Appropriate mood.   Studies:  No results found for this basename: HGB, WBC, PLT,  in the last 72 hours  No results found for this basename: NA, K, CL, CO2, BUN, CREATININE, CALCIUM, MAGNESIUM, GFRNONAA, GFRAA,  in the last 72 hours   No results found for this basename: PT, INR, APTT,  in the last 72 hours   No components found with this basename: ABG,     Assessment:  Left ureter stone.  Plan: To OR for left ureteroscopy, cystoscopy, laser  lithotripsy, left retrograde Ogren, and left ureter stent exchange.

## 2013-10-12 NOTE — Anesthesia Postprocedure Evaluation (Signed)
  Anesthesia Post-op Note  Patient: Jay Mercer  Procedure(s) Performed: Procedure(s) (LRB): CYSTOSCOPY WITH RETROGRADE PYELOGRAM, URETEROSCOPY AND STENT EXCHANGE (Left)  Patient Location: PACU  Anesthesia Type: General  Level of Consciousness: awake and alert   Airway and Oxygen Therapy: Patient Spontanous Breathing  Post-op Pain: mild  Post-op Assessment: Post-op Vital signs reviewed, Patient's Cardiovascular Status Stable, Respiratory Function Stable, Patent Airway and No signs of Nausea or vomiting  Last Vitals:  Filed Vitals:   10/10/13 1820  BP: 125/87  Pulse: 50  Temp: 36.1 C  Resp: 16    Post-op Vital Signs: stable   Complications: No apparent anesthesia complications

## 2013-10-15 ENCOUNTER — Encounter (HOSPITAL_BASED_OUTPATIENT_CLINIC_OR_DEPARTMENT_OTHER): Payer: Self-pay | Admitting: Urology

## 2014-05-14 ENCOUNTER — Encounter (HOSPITAL_BASED_OUTPATIENT_CLINIC_OR_DEPARTMENT_OTHER): Payer: Self-pay | Admitting: Urology

## 2014-07-16 ENCOUNTER — Other Ambulatory Visit: Payer: Self-pay | Admitting: Physician Assistant

## 2014-07-16 DIAGNOSIS — R945 Abnormal results of liver function studies: Principal | ICD-10-CM

## 2014-07-16 DIAGNOSIS — R7989 Other specified abnormal findings of blood chemistry: Secondary | ICD-10-CM

## 2014-07-22 ENCOUNTER — Ambulatory Visit
Admission: RE | Admit: 2014-07-22 | Discharge: 2014-07-22 | Disposition: A | Discharge status: Home / Self Care | Payer: 59 | Source: Ambulatory Visit | Attending: Physician Assistant | Admitting: Physician Assistant

## 2014-07-22 DIAGNOSIS — R7989 Other specified abnormal findings of blood chemistry: Secondary | ICD-10-CM

## 2014-07-22 DIAGNOSIS — R945 Abnormal results of liver function studies: Principal | ICD-10-CM

## 2015-01-24 ENCOUNTER — Ambulatory Visit (HOSPITAL_COMMUNITY)
Admission: RE | Admit: 2015-01-24 | Discharge: 2015-01-24 | Disposition: A | Discharge status: Home / Self Care | Payer: 59 | Source: Ambulatory Visit | Attending: Orthopedic Surgery | Admitting: Orthopedic Surgery

## 2015-01-24 ENCOUNTER — Other Ambulatory Visit (HOSPITAL_COMMUNITY): Payer: Self-pay | Admitting: Orthopedic Surgery

## 2015-01-24 DIAGNOSIS — Z1389 Encounter for screening for other disorder: Secondary | ICD-10-CM | POA: Diagnosis present

## 2015-01-24 DIAGNOSIS — M25569 Pain in unspecified knee: Secondary | ICD-10-CM

## 2016-03-03 ENCOUNTER — Ambulatory Visit (INDEPENDENT_AMBULATORY_CARE_PROVIDER_SITE_OTHER): Payer: 59 | Admitting: Podiatry

## 2016-03-03 ENCOUNTER — Encounter: Payer: Self-pay | Admitting: Podiatry

## 2016-03-03 ENCOUNTER — Ambulatory Visit (INDEPENDENT_AMBULATORY_CARE_PROVIDER_SITE_OTHER): Payer: 59

## 2016-03-03 DIAGNOSIS — M216X9 Other acquired deformities of unspecified foot: Secondary | ICD-10-CM

## 2016-03-03 DIAGNOSIS — L84 Corns and callosities: Secondary | ICD-10-CM

## 2016-03-03 NOTE — Progress Notes (Signed)
Subjective:     Patient ID: Jay DukesMark E Arquette, male   DOB: 10/18/1955, 60 y.o.   MRN: [id number]  HPI patient states she's had problems with chronic lesion plantar aspect left foot that makes it hard for him to walk or wear shoe gear comfortably. States that it's been going on for a long time and he's had it frozen 5 different times without relief and it's increasingly hard for him to walk with any degree of comfort   Review of Systems  All other systems reviewed and are negative.      Objective:   Physical Exam  Constitutional: He is oriented to person, place, and time.  Cardiovascular: Intact distal pulses.   Musculoskeletal: Normal range of motion.  Neurological: He is oriented to person, place, and time.  Skin: Skin is warm.  Nursing note and vitals reviewed.  neurovascular status intact muscle strength adequate range of motion within normal limits with patient noted to have pain in the plantar aspect left fifth metatarsal head with keratotic lesion formation and pain when palpated. 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 I've recommended a fifth metatarsal head resection reviewed condition and x-rays and patient wants surgery and wants to review today. I allowed him to go over procedure going over alternative treatments complications and he understands risk wants surgery signed consent form and is scheduled for outpatient surgery understanding recovery can take up to 6 months. Patient had air fracture walker dispensed for initial postoperative period and will be seen back for surgery and is encouraged to call with questions prior to procedure  Indicated lesion to be on the fifth metatarsal  head left with no other pathology

## 2016-03-03 NOTE — Patient Instructions (Signed)

## 2016-03-03 NOTE — Progress Notes (Signed)
   Subjective:    Patient ID: Jay DukesMark E Mercer, male    DOB: 08/02/1955, 60 y.o.   MRN: [id number]  HPI  Chief Complaint  Patient presents with  . Foot Pain    L plantar forefoot ... Pt states "it's been frozen with nitrogen 4 or 5 times now with no improvement.  Burning at times.         Review of Systems  All other systems reviewed and are negative.      Objective:   Physical Exam        Assessment & Plan:

## 2016-03-16 ENCOUNTER — Encounter: Payer: Self-pay | Admitting: Podiatry

## 2016-03-16 DIAGNOSIS — M21542 Acquired clubfoot, left foot: Secondary | ICD-10-CM | POA: Diagnosis not present

## 2016-03-22 NOTE — Progress Notes (Signed)
DOS 10.03.2017 Removal 5th Metatarsal Head Left

## 2016-03-24 ENCOUNTER — Ambulatory Visit (INDEPENDENT_AMBULATORY_CARE_PROVIDER_SITE_OTHER): Payer: 59 | Admitting: Podiatry

## 2016-03-24 ENCOUNTER — Ambulatory Visit (INDEPENDENT_AMBULATORY_CARE_PROVIDER_SITE_OTHER): Payer: 59

## 2016-03-24 DIAGNOSIS — L84 Corns and callosities: Secondary | ICD-10-CM

## 2016-03-24 DIAGNOSIS — Z9889 Other specified postprocedural states: Secondary | ICD-10-CM

## 2016-03-24 DIAGNOSIS — M216X9 Other acquired deformities of unspecified foot: Secondary | ICD-10-CM | POA: Diagnosis not present

## 2016-03-24 NOTE — Progress Notes (Signed)
Subjective:     Patient ID: Jay Mercer, male   DOB: 10/28/1955, 60 y.o.   MRN: [id number]  HPI patient states she's doing great with minimal discomfort   Review of Systems     Objective:   Physical Exam Neurovascular status intact negative Homans sign noted with well healed surgical site left fifth metatarsal with wound edges well coapted stitches in place    Assessment:     Doing well post fifth metatarsal head resection left    Plan:     Advised on anti-inflammatories and reapplied sterile dressing continue immobilization and dispensed surgical shoe and continue elevation  X-ray indicates satisfactory section of had a fifth metatarsal left

## 2016-03-25 ENCOUNTER — Other Ambulatory Visit: Payer: Self-pay

## 2016-04-02 ENCOUNTER — Ambulatory Visit (INDEPENDENT_AMBULATORY_CARE_PROVIDER_SITE_OTHER): Payer: 59 | Admitting: Podiatry

## 2016-04-02 DIAGNOSIS — Z9889 Other specified postprocedural states: Secondary | ICD-10-CM

## 2016-04-02 DIAGNOSIS — M216X9 Other acquired deformities of unspecified foot: Secondary | ICD-10-CM

## 2016-04-02 DIAGNOSIS — L84 Corns and callosities: Secondary | ICD-10-CM

## 2016-04-05 NOTE — Progress Notes (Signed)
Subjective: Jay DukesMark E Mercer is a 60 y.o. is seen today in office s/p left 5th metatarsal head resection preformed on 03/16/16. He states the area is "sore" but it is improving. Continued with surgical shoe. Denies any systemic complaints such as fevers, chills, nausea, vomiting. No calf pain, chest pain, shortness of breath.   Objective: General: No acute distress, AAOx3  DP/PT pulses palpable 2/4, CRT < 3 sec to all digits.  Protective sensation intact. Motor function intact.  Left foot: Incision is well coapted without any evidence of dehiscence. There is no surrounding erythema, ascending cellulitis, fluctuance, crepitus, malodor, drainage/purulence. There is minimal edema around the surgical site. There is mild pain along the surgical site.  No other areas of tenderness to bilateral lower extremities.  No other open lesions or pre-ulcerative lesions.  No pain with calf compression, swelling, warmth, erythema.   Assessment and Plan:  Status post left 5th metatarsal head resection, doing well with no complications   -Treatment options discussed including all alternatives, risks, and complications -Sutures removed today without complication and there was no gapping of the incision. Antibiotic was applied. Continue with this. -He is started transition to a regular shoe as tolerated. Discussed gradual transition. Offloading pad was dispensed. -Ice/elevation -Pain medication as needed. -Monitor for any clinical signs or symptoms of infection and DVT/PE and directed to call the office immediately should any occur or go to the ER. -Follow-up as scheduled or sooner if any problems arise. In the meantime, encouraged to call the office with any questions, concerns, change in symptoms.   Jay CurdMatthew Mercer, DPM

## 2016-04-16 ENCOUNTER — Ambulatory Visit (INDEPENDENT_AMBULATORY_CARE_PROVIDER_SITE_OTHER): Payer: 59

## 2016-04-16 ENCOUNTER — Ambulatory Visit (INDEPENDENT_AMBULATORY_CARE_PROVIDER_SITE_OTHER): Payer: 59 | Admitting: Podiatry

## 2016-04-16 DIAGNOSIS — Z9889 Other specified postprocedural states: Secondary | ICD-10-CM

## 2016-04-16 DIAGNOSIS — M21542 Acquired clubfoot, left foot: Secondary | ICD-10-CM | POA: Diagnosis not present

## 2016-04-16 NOTE — Progress Notes (Signed)
This patient returns to the office following surgery for the removal of the fifth metatarsal head left foot. He presents the office today with healing noted at the incision site states that he has started walking again in his regular footgear and he is having mild pain and discomfort noted at the surgical site. He returns to the office for his postoperative visit  Neurovascular status is intact. Patient has good wound coaptation and with healing at the incision site. Patient has minimal swelling noted at the surgical site of the left foot. No palpable pain noted. No evidence of any callus  on the plantar aspect of the fifth metatarsal left foot  S/p foot surgery  ROV  examination of his x-ray reveals absence of the fifth metatarsal head patient was instructed went his foot becomes sore wearing his soft tissue that he should return to his wooden shoe until the pain subsides the surgical site looks good at 4 weeks and I told the patient that he needs an additional 4 weeks for the soft tissue to heal properly. He is to return the office in 4 weeks for further evaluation and treatment   Helane GuntherGregory Biran Mayberry DPM

## 2016-05-03 ENCOUNTER — Encounter: Payer: Self-pay | Admitting: Podiatry

## 2016-05-03 ENCOUNTER — Ambulatory Visit (INDEPENDENT_AMBULATORY_CARE_PROVIDER_SITE_OTHER): Payer: 59

## 2016-05-03 ENCOUNTER — Ambulatory Visit (INDEPENDENT_AMBULATORY_CARE_PROVIDER_SITE_OTHER): Payer: 59 | Admitting: Podiatry

## 2016-05-03 DIAGNOSIS — L84 Corns and callosities: Secondary | ICD-10-CM

## 2016-05-03 DIAGNOSIS — Z9889 Other specified postprocedural states: Secondary | ICD-10-CM

## 2016-05-03 NOTE — Progress Notes (Signed)
Subjective:     Patient ID: Jay DukesMark E Daigle, male   DOB: 12/28/1955, 60 y.o.   MRN: [id number]  HPI patient states that he still has some swelling and cannot wear certain shoes but does have it diminishment of discomfort he had them originally   Review of Systems     Objective:   Physical Exam Neurovascular status intact negative Homans sign noted with well healed surgical site left fifth metatarsal with no plantar keratotic lesion noted at this time with moderate edema still noted in the foot consistent with healing process    Assessment:     Doing well overall fifth metatarsal head resection left with still some discomfort but quite a bit improved that should gradually get better with time    Plan:     Will slowly try to wear his steel toe shoes and hopefully can return to work in approximately 2 weeks but it may take a little bit longer for him to get comfortable in the shoes. Patient will be seen back to recheck

## 2016-10-21 DIAGNOSIS — H6243 Otitis externa in other diseases classified elsewhere, bilateral: Secondary | ICD-10-CM | POA: Diagnosis not present

## 2016-10-21 DIAGNOSIS — B369 Superficial mycosis, unspecified: Secondary | ICD-10-CM | POA: Diagnosis not present

## 2016-10-21 DIAGNOSIS — Z87891 Personal history of nicotine dependence: Secondary | ICD-10-CM | POA: Diagnosis not present

## 2016-11-01 DIAGNOSIS — B369 Superficial mycosis, unspecified: Secondary | ICD-10-CM | POA: Diagnosis not present

## 2016-11-01 DIAGNOSIS — H6243 Otitis externa in other diseases classified elsewhere, bilateral: Secondary | ICD-10-CM | POA: Diagnosis not present

## 2016-11-10 DIAGNOSIS — H6243 Otitis externa in other diseases classified elsewhere, bilateral: Secondary | ICD-10-CM | POA: Diagnosis not present

## 2016-11-10 DIAGNOSIS — B369 Superficial mycosis, unspecified: Secondary | ICD-10-CM | POA: Diagnosis not present

## 2016-11-22 DIAGNOSIS — B369 Superficial mycosis, unspecified: Secondary | ICD-10-CM | POA: Diagnosis not present

## 2016-11-22 DIAGNOSIS — H6243 Otitis externa in other diseases classified elsewhere, bilateral: Secondary | ICD-10-CM | POA: Diagnosis not present

## 2016-12-21 DIAGNOSIS — B369 Superficial mycosis, unspecified: Secondary | ICD-10-CM | POA: Diagnosis not present

## 2016-12-21 DIAGNOSIS — H6240 Otitis externa in other diseases classified elsewhere, unspecified ear: Secondary | ICD-10-CM | POA: Diagnosis not present

## 2017-03-04 DIAGNOSIS — E7800 Pure hypercholesterolemia, unspecified: Secondary | ICD-10-CM | POA: Diagnosis not present

## 2017-03-04 DIAGNOSIS — M109 Gout, unspecified: Secondary | ICD-10-CM | POA: Diagnosis not present

## 2017-03-04 DIAGNOSIS — K219 Gastro-esophageal reflux disease without esophagitis: Secondary | ICD-10-CM | POA: Diagnosis not present

## 2017-03-04 DIAGNOSIS — Z23 Encounter for immunization: Secondary | ICD-10-CM | POA: Diagnosis not present

## 2017-03-04 DIAGNOSIS — E039 Hypothyroidism, unspecified: Secondary | ICD-10-CM | POA: Diagnosis not present

## 2017-09-05 DIAGNOSIS — M109 Gout, unspecified: Secondary | ICD-10-CM | POA: Diagnosis not present

## 2017-09-05 DIAGNOSIS — E039 Hypothyroidism, unspecified: Secondary | ICD-10-CM | POA: Diagnosis not present

## 2017-09-05 DIAGNOSIS — E7800 Pure hypercholesterolemia, unspecified: Secondary | ICD-10-CM | POA: Diagnosis not present

## 2017-09-05 DIAGNOSIS — K219 Gastro-esophageal reflux disease without esophagitis: Secondary | ICD-10-CM | POA: Diagnosis not present

## 2018-04-25 ENCOUNTER — Other Ambulatory Visit: Payer: Self-pay | Admitting: Physician Assistant

## 2018-04-25 ENCOUNTER — Ambulatory Visit
Admission: RE | Admit: 2018-04-25 | Discharge: 2018-04-25 | Disposition: A | Discharge status: Home / Self Care | Payer: Self-pay | Source: Ambulatory Visit | Attending: Physician Assistant | Admitting: Physician Assistant

## 2018-04-25 DIAGNOSIS — E7800 Pure hypercholesterolemia, unspecified: Secondary | ICD-10-CM | POA: Diagnosis not present

## 2018-04-25 DIAGNOSIS — R059 Cough, unspecified: Secondary | ICD-10-CM

## 2018-04-25 DIAGNOSIS — Z125 Encounter for screening for malignant neoplasm of prostate: Secondary | ICD-10-CM | POA: Diagnosis not present

## 2018-04-25 DIAGNOSIS — R05 Cough: Secondary | ICD-10-CM | POA: Diagnosis not present

## 2018-04-25 DIAGNOSIS — Z23 Encounter for immunization: Secondary | ICD-10-CM | POA: Diagnosis not present

## 2018-04-25 DIAGNOSIS — Z0000 Encounter for general adult medical examination without abnormal findings: Secondary | ICD-10-CM | POA: Diagnosis not present

## 2019-04-13 ENCOUNTER — Encounter (HOSPITAL_COMMUNITY): Payer: Self-pay

## 2019-04-13 ENCOUNTER — Ambulatory Visit (HOSPITAL_COMMUNITY)
Admission: EM | Admit: 2019-04-13 | Discharge: 2019-04-13 | Payer: HRSA Program | Attending: Family Medicine | Admitting: Family Medicine

## 2019-04-13 ENCOUNTER — Other Ambulatory Visit: Payer: Self-pay

## 2019-04-13 ENCOUNTER — Emergency Department (HOSPITAL_COMMUNITY)
Admission: EM | Admit: 2019-04-13 | Discharge: 2019-04-14 | Disposition: A | Discharge status: Home / Self Care | Payer: Self-pay | Attending: Emergency Medicine | Admitting: Emergency Medicine

## 2019-04-13 DIAGNOSIS — R05 Cough: Secondary | ICD-10-CM

## 2019-04-13 DIAGNOSIS — E039 Hypothyroidism, unspecified: Secondary | ICD-10-CM | POA: Insufficient documentation

## 2019-04-13 DIAGNOSIS — Z87891 Personal history of nicotine dependence: Secondary | ICD-10-CM | POA: Insufficient documentation

## 2019-04-13 DIAGNOSIS — Z79899 Other long term (current) drug therapy: Secondary | ICD-10-CM | POA: Diagnosis not present

## 2019-04-13 DIAGNOSIS — J189 Pneumonia, unspecified organism: Secondary | ICD-10-CM | POA: Insufficient documentation

## 2019-04-13 DIAGNOSIS — K921 Melena: Secondary | ICD-10-CM | POA: Insufficient documentation

## 2019-04-13 DIAGNOSIS — R531 Weakness: Secondary | ICD-10-CM

## 2019-04-13 DIAGNOSIS — U071 COVID-19: Secondary | ICD-10-CM | POA: Insufficient documentation

## 2019-04-13 DIAGNOSIS — R059 Cough, unspecified: Secondary | ICD-10-CM

## 2019-04-13 LAB — URINALYSIS, ROUTINE W REFLEX MICROSCOPIC
Bilirubin Urine: NEGATIVE
Glucose, UA: NEGATIVE mg/dL
Hgb urine dipstick: NEGATIVE
Ketones, ur: NEGATIVE mg/dL
Leukocytes,Ua: NEGATIVE
Nitrite: NEGATIVE
Protein, ur: 100 mg/dL — AB
Specific Gravity, Urine: 1.03 (ref 1.005–1.030)
pH: 5 (ref 5.0–8.0)

## 2019-04-13 LAB — COMPREHENSIVE METABOLIC PANEL
ALT: 54 U/L — ABNORMAL HIGH (ref 0–44)
AST: 60 U/L — ABNORMAL HIGH (ref 15–41)
Albumin: 3.7 g/dL (ref 3.5–5.0)
Alkaline Phosphatase: 44 U/L (ref 38–126)
Anion gap: 14 (ref 5–15)
BUN: 20 mg/dL (ref 8–23)
CO2: 22 mmol/L (ref 22–32)
Calcium: 9.1 mg/dL (ref 8.9–10.3)
Chloride: 101 mmol/L (ref 98–111)
Creatinine, Ser: 1.4 mg/dL — ABNORMAL HIGH (ref 0.61–1.24)
GFR calc Af Amer: >60 mL/min (ref >60)
GFR calc non Af Amer: 54 mL/min — ABNORMAL LOW (ref >60)
Glucose, Bld: 116 mg/dL — ABNORMAL HIGH (ref 70–99)
Potassium: 3.7 mmol/L (ref 3.5–5.1)
Sodium: 137 mmol/L (ref 135–145)
Total Bilirubin: 1.2 mg/dL (ref 0.3–1.2)
Total Protein: 8.2 g/dL — ABNORMAL HIGH (ref 6.5–8.1)

## 2019-04-13 LAB — CBC
HCT: 50.5 % (ref 39.0–52.0)
Hemoglobin: 16.8 g/dL (ref 13.0–17.0)
MCH: 29.4 pg (ref 26.0–34.0)
MCHC: 33.3 g/dL (ref 30.0–36.0)
MCV: 88.4 fL (ref 80.0–100.0)
Platelets: 165 10*3/uL (ref 150–400)
RBC: 5.71 MIL/uL (ref 4.22–5.81)
RDW: 13.2 % (ref 11.5–15.5)
WBC: 7.2 10*3/uL (ref 4.0–10.5)
nRBC: 0 % (ref 0.0–0.2)

## 2019-04-13 LAB — TYPE AND SCREEN
ABO/RH(D): A POS
Antibody Screen: NEGATIVE

## 2019-04-13 LAB — LIPASE, BLOOD: Lipase: 36 U/L (ref 11–51)

## 2019-04-13 MED ORDER — SODIUM CHLORIDE 0.9% FLUSH: 3.0000 mL | Freq: Once | INTRAVENOUS | Status: DC

## 2019-04-13 NOTE — Discharge Instructions (Signed)
Sent to ED

## 2019-04-13 NOTE — ED Notes (Signed)
Covid test ordered and collected. I sent to main lab for processing.

## 2019-04-13 NOTE — ED Triage Notes (Signed)
Pt presents to UC w/ c/o weakness, N/V, decreased taste x1 week. Pt reports bleeding gums when brushing teeth and black stool x1 week.

## 2019-04-13 NOTE — ED Notes (Signed)
Patient is being discharged from the Urgent [place] and sent to the Emergency Department via personal vehicle by family member. Per provider Debara Pickett, patient is stable but in need of higher level of care due to needing further evaluation. Patient is aware and verbalizes understanding of plan of care.   Vitals:   04/13/19 1649  BP: 119/74  Pulse: (!) 110  Resp: 16  Temp: 100.2 F (37.9 C)  SpO2: 91%

## 2019-04-13 NOTE — ED Triage Notes (Signed)
Pt arrives POV from UC for further workup. Pt reports that he went to UC today for eval of weakness, malaise, N/V, decreased taste. Pt also reports black, tarry stool x 1 week as well as significant oral bleeding. Pt reports he "filled the sink up with blood" the last few times he brushed his teeth,

## 2019-04-13 NOTE — ED Provider Notes (Signed)
[facility]    CSN: [id number] Arrival date & time: 04/13/19  1558      History   Chief Complaint Chief Complaint  Patient presents with  . black stool, weakness    HPI Jay Mercer is a 63 y.o. male history of hypothyroid, anemia, presenting today for evaluation of fatigue, weakness, black stools and cough.  Patient states that 1 week ago he began feeling really bad.  He has had poor appetite along with decreased taste.  He has had cough which has caused coughing spells that have led to heaving.  He has not had any vomiting as he has had very poor oral intake.  He has had some nausea.  He notes that he has had 2 episodes of dark watery stools.  One last Saturday, 1 today.  He has had some abdominal pain associated with coughing.   HPI  Past Medical History:  Diagnosis Date  . Anemia   . Arthritis   . Frequency of urination   . Gout   . H/O hiatal hernia   . History of chronic bronchitis   . History of kidney stones   . Hypothyroidism   . Left ureteral calculus   . Ureteral stenosis, left   . Urgency of urination   . Wears glasses     There are no active problems to display for this patient.   Past Surgical History:  Procedure Laterality Date  . CYSTOSCOPY W/ URETERAL STENT PLACEMENT Left 09/22/2013   Procedure: CYSTOSCOPY WITH RETROGRADE PYELOGRAM/URETERAL STENT PLACEMENT;  Surgeon: Milford Cageaniel Young Woodruff, MD;  Location: [facility];  Service: Urology;  Laterality: Left;  . CYSTOSCOPY WITH RETROGRADE PYELOGRAM, URETEROSCOPY AND STENT PLACEMENT Left 10/10/2013   Procedure: CYSTOSCOPY WITH RETROGRADE PYELOGRAM, URETEROSCOPY AND STENT EXCHANGE;  Surgeon: Magdalene Mollyaniel Y Woodruff, MD;  [facility];  Service: Urology;  Laterality: Left;  . EXTRACORPOREAL SHOCK WAVE LITHOTRIPSY  2013  . TONSILLECTOMY         Home Medications    Prior to Admission medications   Medication Sig Start Date End Date Taking? Authorizing Provider  atorvastatin  (LIPITOR) 20 MG tablet  02/24/16   [provider]  febuxostat (ULORIC) 40 MG tablet Take 40 mg by mouth every morning.    [provider]  HYDROcodone-acetaminophen (NORCO) 10-325 MG tablet Take 1 tablet by mouth every 4 (four) hours as needed.    Lenn Sinkegal, Norman S, DPM  levothyroxine (SYNTHROID, LEVOTHROID) 75 MCG tablet Take 75 mcg by mouth daily before breakfast.    [provider]  neomycin-polymyxin-hydrocortisone (CORTISPORIN) 3.5-10000-1 otic suspension  04/16/16   [provider]  Omega-3 Fatty Acids (FISH OIL) 1000 MG CAPS Take 2 capsules by mouth daily.    [provider]  ondansetron (ZOFRAN) 8 MG tablet Take 8 mg by mouth every 8 (eight) hours as needed for nausea or vomiting.    [provider]  pantoprazole (PROTONIX) 40 MG tablet Take 40 mg by mouth every morning.     [provider]  tamsulosin (FLOMAX) 0.4 MG CAPS capsule Take 1 capsule (0.4 mg total) by mouth once. 09/22/13   Natalia LeatherwoodWoodruff, Daniel, MD  VIAGRA 50 MG tablet  02/24/16   [provider]    Family History Family History  Adopted: Yes  Problem Relation Age of Onset  . Healthy Mother   . Healthy Father     Social History Social History   Tobacco Use  . Smoking status: Former Smoker  Packs/day: 1.50    Years: 23.00    Pack years: 34.50    Types: Cigarettes    Quit date: 10/08/1993    Years since quitting: 25.5  . Smokeless tobacco: Former Neurosurgeon    Types: Chew    Quit date: 10/08/1988  Substance Use Topics  . Alcohol use: No  . Drug use: No     Allergies   Hydrocodone   Review of Systems Review of Systems  Constitutional: Negative for activity change, appetite change, chills, fatigue and fever.  HENT: Positive for congestion. Negative for ear pain, rhinorrhea, sinus pressure, sore throat and trouble swallowing.   Eyes: Negative for discharge and redness.  Respiratory: Positive for cough. Negative for chest tightness and shortness of  breath.   Cardiovascular: Negative for chest pain.  Gastrointestinal: Positive for abdominal pain, diarrhea and nausea. Negative for vomiting.  Musculoskeletal: Negative for myalgias.  Skin: Negative for rash.  Neurological: Positive for weakness and light-headedness. Negative for dizziness and headaches.     Physical Exam Triage Vital Signs ED Triage Vitals  Enc Vitals Group     BP 04/13/19 1649 119/74     Pulse Rate 04/13/19 1649 (!) 110     Resp 04/13/19 1649 16     Temp 04/13/19 1649 100.2 F (37.9 C)     Temp Source 04/13/19 1649 Oral     SpO2 04/13/19 1649 91 %     Weight --      Height --      Head Circumference --      Peak Flow --      Pain Score 04/13/19 1653 0     Pain Loc --      Pain Edu? --      Excl. in GC? --    No data found.  Updated Vital Signs BP 119/74 (BP Location: Left Arm)   Pulse (!) 110   Temp 100.2 F (37.9 C) (Oral)   Resp 16   SpO2 91%   Visual Acuity Right Eye Distance:   Left Eye Distance:   Bilateral Distance:    Right Eye Near:   Left Eye Near:    Bilateral Near:     Physical Exam Vitals signs and nursing note reviewed.  Constitutional:      Appearance: He is well-developed.  HENT:     Head: Normocephalic and atraumatic.     Ears:     Comments: Bilateral ears appear slightly opaque, slightly erythematous and irregular    Mouth/Throat:     Comments: Oral mucosa pink and moist, no tonsillar enlargement or exudate. Posterior pharynx patent and nonerythematous, no uvula deviation or swelling. Normal phonation.  Eyes:     Conjunctiva/sclera: Conjunctivae normal.  Neck:     Musculoskeletal: Neck supple.  Cardiovascular:     Rate and Rhythm: Normal rate and regular rhythm.     Heart sounds: No murmur.  Pulmonary:     Effort: Pulmonary effort is normal. No respiratory distress.     Breath sounds: Normal breath sounds.     Comments: Breathing comfortably at rest, CTABL, no wheezing, rales or other adventitious sounds  auscultated  Oxygen rechecked, dropped as low as 88, did go up as high as 95%, but averaging 91/92%. Abdominal:     Palpations: Abdomen is soft.     Tenderness: There is no abdominal tenderness.     Comments: Nontender to light and deep palpation throughout abdomen  Skin:    General: Skin is warm and dry.  Neurological:     Mental Status: He is alert.      UC Treatments / Results  Labs (all labs ordered are listed, but only abnormal results are displayed) Labs Reviewed - No data to display  EKG   Radiology No results found.  Procedures Procedures (including critical care time)  Medications Ordered in UC Medications - No data to display  Initial Impression / Assessment and Plan / UC Course  I have reviewed the triage vital signs and the nursing notes.  Pertinent labs & imaging results that were available during my care of the patient were reviewed by me and considered in my medical decision making (see chart for details).     Given poor oral intake x1 week, low-grade fevers today with tachycardia, lower oxygen saturations compared to his baseline along with possible black stools and easily bleeding gums recommending further evaluation and work-up in the emergency room.  Feel patient at least needs chest x-ray, further blood work and possible fluids.  Opted to defer chest x-ray to emergency room in order to not delay care as I felt this likely would be his disposition despite imaging.  Do not feel I could comfortably discharge home without further work-up.  Discharged with his daughter who plans to take him to [place] in hoped for shorter wait time.   Discussed strict return precautions. Patient verbalized understanding and is agreeable with plan.  Final Clinical Impressions(s) / UC Diagnoses   Final diagnoses:  Cough  Weakness  Black stools     Discharge Instructions     Sent to ED    ED Prescriptions    None     PDMP not reviewed this  encounter.   Janith Lima, [place] 04/13/19 1737

## 2019-04-14 ENCOUNTER — Emergency Department (HOSPITAL_COMMUNITY): Payer: Self-pay

## 2019-04-14 LAB — ABO/RH: ABO/RH(D): A POS

## 2019-04-14 LAB — POC OCCULT BLOOD, ED: Fecal Occult Bld: POSITIVE — AB

## 2019-04-14 MED ORDER — DOXYCYCLINE HYCLATE 100 MG PO TABS
100.0000 mg | ORAL_TABLET | Freq: Once | ORAL | Status: AC
  Administered 2019-04-14: 100 mg via ORAL
  Filled 2019-04-14: qty 1

## 2019-04-14 MED ORDER — DOXYCYCLINE HYCLATE 100 MG PO CAPS: 100.0000 mg | ORAL_CAPSULE | Freq: Two times a day (BID) | ORAL | 0 refills | Status: AC

## 2019-04-14 MED ORDER — SODIUM CHLORIDE 0.9 % IV BOLUS
1000.0000 mL | Freq: Once | INTRAVENOUS | Status: AC
  Administered 2019-04-14: 1000 mL via INTRAVENOUS

## 2019-04-14 NOTE — ED Provider Notes (Signed)
[facility] Provider Note  CSN: [id number] Arrival date & time: 04/13/19 1735  Chief Complaint(s) Cough and GI Bleeding  HPI Jay Mercer is a 63 y.o. male with a h/o chronic bronchitis here from UC after being evaluated for 1.5 weeks generalized fatigue, cough, and melenic stools. Endorses subjective fevers, decreased appetite. Report 2 episodes of gingival bleeding. [facility], but denies known exposure. No CP, SOB, emesis, or urinary sx.   HPI  Past Medical History Past Medical History:  Diagnosis Date  . Anemia   . Arthritis   . Frequency of urination   . Gout   . H/O hiatal hernia   . History of chronic bronchitis   . History of kidney stones   . Hypothyroidism   . Left ureteral calculus   . Ureteral stenosis, left   . Urgency of urination   . Wears glasses    There are no active problems to display for this patient.  Home Medication(s) Prior to Admission medications   Medication Sig Start Date End Date Taking? Authorizing Provider  levothyroxine (SYNTHROID, LEVOTHROID) 75 MCG tablet Take 75 mcg by mouth daily before breakfast.   Yes [provider]  ondansetron (ZOFRAN) 8 MG tablet Take 8 mg by mouth every 8 (eight) hours as needed for nausea or vomiting.   Yes [provider]  pantoprazole (PROTONIX) 40 MG tablet Take 40 mg by mouth every morning.    Yes [provider]  doxycycline (VIBRAMYCIN) 100 MG capsule Take 1 capsule (100 mg total) by mouth 2 (two) times daily for 7 days. 04/14/19 04/21/19  Nira Connardama, Houda Brau Eduardo, MD  tamsulosin (FLOMAX) 0.4 MG CAPS capsule Take 1 capsule (0.4 mg total) by mouth once. Patient not taking: Reported on 04/14/2019 09/22/13   Natalia LeatherwoodWoodruff, Daniel, MD                                                                                                                                    Past Surgical History Past Surgical History:  Procedure Laterality  Date  . CYSTOSCOPY W/ URETERAL STENT PLACEMENT Left 09/22/2013   Procedure: CYSTOSCOPY WITH RETROGRADE PYELOGRAM/URETERAL STENT PLACEMENT;  Surgeon: Milford Cageaniel Young Woodruff, MD;  Location: [facility];  Service: Urology;  Laterality: Left;  . CYSTOSCOPY WITH RETROGRADE PYELOGRAM, URETEROSCOPY AND STENT PLACEMENT Left 10/10/2013   Procedure: CYSTOSCOPY WITH RETROGRADE PYELOGRAM, URETEROSCOPY AND STENT EXCHANGE;  Surgeon: Magdalene Mollyaniel Y Woodruff, MD;  [facility];  Service: Urology;  Laterality: Left;  . EXTRACORPOREAL SHOCK WAVE LITHOTRIPSY  2013  . TONSILLECTOMY     Family History Family History  Adopted: Yes  Problem Relation Age of Onset  . Healthy Mother   . Healthy Father     Social History Social History   Tobacco Use  . Smoking status: Former Smoker    Packs/day: 1.50    Years: 23.00  Pack years: 34.50    Types: Cigarettes    Quit date: 10/08/1993    Years since quitting: 25.5  . Smokeless tobacco: Former Systems developer    Types: Chew    Quit date: 10/08/1988  Substance Use Topics  . Alcohol use: No  . Drug use: No   Allergies Hydrocodone  Review of Systems Review of Systems All other systems are reviewed and are negative for acute change except as noted in the HPI  Physical Exam Vital Signs  I have reviewed the triage vital signs BP (!) 112/92   Pulse 98   Temp 99.5 F (37.5 C) (Oral)   Resp 20   Ht 6\' 1"  (1.854 m)   Wt 117.9 kg   SpO2 95%   BMI 34.30 kg/m   Physical Exam Vitals signs reviewed.  Constitutional:      General: He is not in acute distress.    Appearance: He is well-developed. He is not diaphoretic.  HENT:     Head: Normocephalic and atraumatic.     Right Ear: Tympanic membrane is scarred.     Left Ear: Tympanic membrane is scarred.     Nose: Nose normal.  Eyes:     General: No scleral icterus.       Right eye: No discharge.        Left eye: No discharge.     Conjunctiva/sclera: Conjunctivae normal.     Pupils: Pupils are  equal, round, and reactive to light.  Neck:     Musculoskeletal: Normal range of motion and neck supple.  Cardiovascular:     Rate and Rhythm: Normal rate and regular rhythm.     Heart sounds: No murmur. No friction rub. No gallop.   Pulmonary:     Effort: Pulmonary effort is normal. No respiratory distress.     Breath sounds: Normal breath sounds. No stridor. No rales.  Abdominal:     General: There is no distension.     Palpations: Abdomen is soft.     Tenderness: There is no abdominal tenderness.  Genitourinary:    Rectum: Guaiac result positive.  Musculoskeletal:        General: No tenderness.  Skin:    General: Skin is warm and dry.     Findings: No erythema or rash.  Neurological:     Mental Status: He is alert and oriented to person, place, and time.     ED Results and Treatments Labs (all labs ordered are listed, but only abnormal results are displayed) Labs Reviewed  COMPREHENSIVE METABOLIC PANEL - Abnormal; Notable for the following components:      Result Value   Glucose, Bld 116 (*)    Creatinine, Ser 1.40 (*)    Total Protein 8.2 (*)    AST 60 (*)    ALT 54 (*)    GFR calc non Af Amer 54 (*)    All other components within normal limits  URINALYSIS, ROUTINE W REFLEX MICROSCOPIC - Abnormal; Notable for the following components:   Color, Urine AMBER (*)    APPearance HAZY (*)    Protein, ur 100 (*)    Bacteria, UA RARE (*)    All other components within normal limits  POC OCCULT BLOOD, ED - Abnormal; Notable for the following components:   Fecal Occult Bld POSITIVE (*)    All other components within normal limits  LIPASE, BLOOD  CBC  TYPE AND SCREEN  ABO/RH  EKG  EKG Interpretation  Date/Time:  Friday April 13 2019 18:29:52 EDT Ventricular Rate:  111 PR Interval:  154 QRS Duration: 140 QT Interval:  348 QTC Calculation: 473 R  Axis:   22 Text Interpretation: Sinus tachycardia Right bundle branch block Abnormal ECG When compared with ECG of 09/20/2012, No significant change was found Confirmed by Dione Booze ([id number]) on 04/14/2019 12:20:53 AM      Radiology Dg Chest Port 1 View  Result Date: 04/14/2019 CLINICAL DATA:  Cough and fever. EXAM: PORTABLE CHEST 1 VIEW COMPARISON:  04/25/2018 FINDINGS: The cardiac silhouette is upper limits of normal in size. Stable large hiatal hernia. Interval minimal linear and ill-defined opacity in the right mid lung zone and lateral left mid lung zone. The remainder of the lungs are clear. No pleural fluid. Unremarkable bones. IMPRESSION: 1. Possible early changes of pneumonia in both mid lung zones. 2. Stable large hiatal hernia. Electronically Signed   By: Beckie Salts M.D.   On: 04/14/2019 06:17    Pertinent labs & imaging results that were available during my care of the patient were reviewed by me and considered in my medical decision making (see chart for details).  Medications Ordered in ED Medications  sodium chloride flush (NS) 0.9 % injection 3 mL (has no administration in time range)  sodium chloride 0.9 % bolus 1,000 mL (0 mLs Intravenous Stopped 04/14/19 0705)  doxycycline (VIBRA-TABS) tablet 100 mg (100 mg Oral Given 04/14/19 0651)                                                                                                                                    Procedures Procedures  (including critical care time)  Medical Decision Making / ED Course I have reviewed the nursing notes for this encounter and the patient's prior records (if available in EHR or on provided paperwork).   Maher Shon Geller was evaluated in Emergency Department on 04/14/2019 for the symptoms described in the history of present illness. He was evaluated in the context of the global COVID-19 pandemic, which necessitated consideration that the patient might be at risk for infection with the  SARS-CoV-2 virus that causes COVID-19. Institutional protocols and algorithms that pertain to the evaluation of patients at risk for COVID-19 are in a state of rapid change based on information released by regulatory bodies including the CDC and federal and state organizations. These policies and algorithms were followed during the patient's care in the ED.  Noted to have low grade fever. Otherwise stable. Labs w/o leukocytosis, anemia, significant electrolyte derangements. Mild AKI, but improved from prior. Hemoccult negative, but stool light brown/green CXR notable to developing pneumonia. He does not appear septic. Sating well on RA.  Stable for OP management. First Doxy given here.    The patient appears reasonably screened and/or stabilized for discharge and I doubt any other medical condition or other [facility] requiring further screening, evaluation, or  treatment in the ED at this time prior to discharge.  The patient is safe for discharge with strict return precautions.       Final Clinical Impression(s) / ED Diagnoses Final diagnoses:  Cough  Community acquired pneumonia of right middle lobe of lung     The patient appears reasonably screened and/or stabilized for discharge and I doubt any other medical condition or other [facility] requiring further screening, evaluation, or treatment in the ED at this time prior to discharge.  Disposition: Discharge  Condition: Good  I have discussed the results, Dx and Tx plan with the patient who expressed understanding and agree(s) with the plan. Discharge instructions discussed at great length. The patient was given strict return precautions who verbalized understanding of the instructions. No further questions at time of discharge.    ED Discharge Orders         Ordered    doxycycline (VIBRAMYCIN) 100 MG capsule  2 times daily     04/14/19 0728          Follow Up: Redmon, Noelle, PA-C [address] [place]  [id number] [phone]  Schedule an appointment as soon as possible for a visit  in 5-7 days, If symptoms do not improve or  worsen     This chart was dictated using voice recognition software.  Despite best efforts to proofread,  errors can occur which can change the documentation meaning.   Nira Conn, MD 04/14/19 [phone]

## 2019-04-14 NOTE — ED Notes (Signed)
Patient verbalized understanding of discharge instructions and denies any further needs or questions at this time. VS stable. 

## 2019-04-15 LAB — NOVEL CORONAVIRUS, NAA (HOSP ORDER, SEND-OUT TO REF LAB; TAT 18-24 HRS): SARS-CoV-2, NAA: DETECTED — AB

## 2019-04-16 ENCOUNTER — Telehealth (HOSPITAL_COMMUNITY): Payer: Self-pay | Admitting: Emergency Medicine

## 2019-04-16 NOTE — Telephone Encounter (Signed)
Attempted to reach patient to discuss positive covid test, no answer, left vm to return call.

## 2019-04-17 ENCOUNTER — Telehealth (HOSPITAL_COMMUNITY): Payer: Self-pay | Admitting: Emergency Medicine

## 2019-04-17 NOTE — Telephone Encounter (Signed)
Patient contacted and made aware of  Positive covid  results, verbalized understanding. Pt did not have any questions.

## 2019-04-19 ENCOUNTER — Telehealth: Payer: Self-pay | Admitting: *Deleted

## 2019-04-19 NOTE — Telephone Encounter (Signed)
Karlyn Agee, RN from [facility] called to obtain contact information for the pt so that he can be given isolation orders; she was informed the pt had been contacted on 04/17/2019, and spoke with Ocie Bob, RN; pt address and phone number confirmed; also gave phone number to emergency contact; Anderson Malta verified understanding.

## 2020-03-07 ENCOUNTER — Emergency Department (HOSPITAL_COMMUNITY): Payer: 59

## 2020-03-07 ENCOUNTER — Encounter (HOSPITAL_COMMUNITY): Payer: Self-pay | Admitting: Emergency Medicine

## 2020-03-07 ENCOUNTER — Emergency Department (HOSPITAL_COMMUNITY)
Admission: EM | Admit: 2020-03-07 | Discharge: 2020-03-08 | Disposition: A | Discharge status: Home / Self Care | Payer: 59 | Attending: Emergency Medicine | Admitting: Emergency Medicine

## 2020-03-07 DIAGNOSIS — Z955 Presence of coronary angioplasty implant and graft: Secondary | ICD-10-CM | POA: Diagnosis not present

## 2020-03-07 DIAGNOSIS — E039 Hypothyroidism, unspecified: Secondary | ICD-10-CM | POA: Insufficient documentation

## 2020-03-07 DIAGNOSIS — S42022A Displaced fracture of shaft of left clavicle, initial encounter for closed fracture: Secondary | ICD-10-CM | POA: Insufficient documentation

## 2020-03-07 DIAGNOSIS — Y9355 Activity, bike riding: Secondary | ICD-10-CM | POA: Insufficient documentation

## 2020-03-07 DIAGNOSIS — Z79890 Hormone replacement therapy (postmenopausal): Secondary | ICD-10-CM | POA: Insufficient documentation

## 2020-03-07 DIAGNOSIS — Y92410 Unspecified street and highway as the place of occurrence of the external cause: Secondary | ICD-10-CM | POA: Insufficient documentation

## 2020-03-07 DIAGNOSIS — Z87891 Personal history of nicotine dependence: Secondary | ICD-10-CM | POA: Insufficient documentation

## 2020-03-07 DIAGNOSIS — K440 Diaphragmatic hernia with obstruction, without gangrene: Secondary | ICD-10-CM | POA: Diagnosis not present

## 2020-03-07 DIAGNOSIS — S42102A Fracture of unspecified part of scapula, left shoulder, initial encounter for closed fracture: Secondary | ICD-10-CM | POA: Diagnosis not present

## 2020-03-07 DIAGNOSIS — S4992XA Unspecified injury of left shoulder and upper arm, initial encounter: Secondary | ICD-10-CM | POA: Diagnosis present

## 2020-03-07 MED ORDER — OXYCODONE HCL 5 MG PO TABS: 5.0000 mg | ORAL_TABLET | Freq: Four times a day (QID) | ORAL | Status: DC | PRN

## 2020-03-07 MED ORDER — ONDANSETRON 4 MG PO TBDP: 4.0000 mg | ORAL_TABLET | Freq: Three times a day (TID) | ORAL | Status: DC | PRN

## 2020-03-07 MED ORDER — MORPHINE SULFATE (PF) 4 MG/ML IV SOLN
4.0000 mg | Freq: Once | INTRAVENOUS | Status: AC
  Administered 2020-03-07: 4 mg via INTRAVENOUS
  Filled 2020-03-07: qty 1

## 2020-03-07 MED ORDER — ONDANSETRON HCL 4 MG/2ML IJ SOLN
4.0000 mg | Freq: Once | INTRAMUSCULAR | Status: AC
  Administered 2020-03-07: 4 mg via INTRAVENOUS
  Filled 2020-03-07: qty 2
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
## 2020-03-07 NOTE — ED Notes (Signed)
Pt to CT via stretcher

## 2020-03-08 MED ORDER — ACETAMINOPHEN-CODEINE #3 300-30 MG PO TABS
2.0000 | ORAL_TABLET | Freq: Once | ORAL | Status: AC
  Administered 2020-03-08: 2 via ORAL
  Filled 2020-03-08: qty 2

## 2020-03-08 MED ORDER — ACETAMINOPHEN-CODEINE #3 300-30 MG PO TABS: 1.0000 | ORAL_TABLET | Freq: Four times a day (QID) | ORAL | 0 refills | Status: AC | PRN
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
## 2020-03-08 NOTE — Progress Notes (Signed)
Orthopedic Tech Progress Note Patient Details:  Kirtis Challis [facility] May 18, 1956 [id number]  Ortho Devices Type of Ortho Device: Arm sling Ortho Device/Splint Location: LUE Ortho Device/Splint Interventions: Application, Adjustment   Post Interventions Patient Tolerated: Well Instructions Provided: Adjustment of device   Nevaeha Finerty E Zylan Almquist 03/08/2020, 12:45 AM

## 2021-03-01 ENCOUNTER — Emergency Department (HOSPITAL_COMMUNITY)
Admission: EM | Admit: 2021-03-01 | Discharge: 2021-03-02 | Disposition: A | Discharge status: Home / Self Care | Payer: 59 | Attending: Emergency Medicine | Admitting: Emergency Medicine

## 2021-03-01 ENCOUNTER — Emergency Department (HOSPITAL_COMMUNITY): Payer: 59

## 2021-03-01 ENCOUNTER — Encounter (HOSPITAL_COMMUNITY): Payer: Self-pay | Admitting: Emergency Medicine

## 2021-03-01 ENCOUNTER — Other Ambulatory Visit: Payer: Self-pay

## 2021-03-01 DIAGNOSIS — Z87891 Personal history of nicotine dependence: Secondary | ICD-10-CM | POA: Insufficient documentation

## 2021-03-01 DIAGNOSIS — Z79899 Other long term (current) drug therapy: Secondary | ICD-10-CM | POA: Diagnosis not present

## 2021-03-01 DIAGNOSIS — R7989 Other specified abnormal findings of blood chemistry: Secondary | ICD-10-CM | POA: Diagnosis not present

## 2021-03-01 DIAGNOSIS — E039 Hypothyroidism, unspecified: Secondary | ICD-10-CM | POA: Insufficient documentation

## 2021-03-01 DIAGNOSIS — K449 Diaphragmatic hernia without obstruction or gangrene: Secondary | ICD-10-CM | POA: Insufficient documentation

## 2021-03-01 DIAGNOSIS — M545 Low back pain, unspecified: Secondary | ICD-10-CM | POA: Diagnosis not present

## 2021-03-01 DIAGNOSIS — R109 Unspecified abdominal pain: Secondary | ICD-10-CM | POA: Diagnosis present

## 2021-03-01 DIAGNOSIS — R030 Elevated blood-pressure reading, without diagnosis of hypertension: Secondary | ICD-10-CM | POA: Diagnosis not present

## 2021-03-01 MED ORDER — KETOROLAC TROMETHAMINE 30 MG/ML IJ SOLN
30.0000 mg | Freq: Once | INTRAMUSCULAR | Status: DC
  Filled 2021-03-01: qty 1

## 2021-03-01 NOTE — ED Triage Notes (Signed)
Patient presents with flank pain that began yesterday, no hematuria. Pain worsened tonight.

## 2021-03-01 NOTE — ED Provider Notes (Signed)
Emergency Medicine Provider Triage Evaluation Note  Jay Mercer , a 65 y.o. male  was evaluated in triage.  Pt complains of right flank pain.  Pain is constant, started yesterday but worsened today.  No dysuria or hematuria, no nausea or vomiting or fevers.  Patient has never had any abdominal surgeries, not having any current abdominal pain.  History of kidney stones, states this pain feels worse  Review of Systems  Positive: Right flank pain Negative: Nausea, vomiting, fever, hematuria, dysuria  Physical Exam  There were no vitals taken for this visit. Gen:   Awake, uncomfortable appearing Resp:  Normal effort  MSK:   Moves extremities without difficulty  Other:  Abdomen is firm, but nontender positive right CVA tenderness.  Medical Decision Making  Medically screening exam initiated at 8:16 PM.  Appropriate orders placed.  Navin Dogan Plush was informed that the remainder of the evaluation will be completed by another provider, this initial triage assessment does not replace that evaluation, and the importance of remaining in the ED until their evaluation is complete.  Patient is uncomfortable, will work-up with abdominal pain and proceed with CT given the tenderness and high suspicion for stone   Theron Arista, PA-C 03/01/21 2019    Bethann Berkshire, MD 03/02/21 1657

## 2021-03-02 LAB — CBC WITH DIFFERENTIAL/PLATELET
Abs Immature Granulocytes: 0.05 10*3/uL (ref 0.00–0.07)
Basophils Absolute: 0.1 10*3/uL (ref 0.0–0.1)
Basophils Relative: 1 %
Eosinophils Absolute: 0.2 10*3/uL (ref 0.0–0.5)
Eosinophils Relative: 2 %
HCT: 48.3 % (ref 39.0–52.0)
Hemoglobin: 15.4 g/dL (ref 13.0–17.0)
Immature Granulocytes: 1 %
Lymphocytes Relative: 40 %
Lymphs Abs: 3.5 10*3/uL (ref 0.7–4.0)
MCH: 27.9 pg (ref 26.0–34.0)
MCHC: 31.9 g/dL (ref 30.0–36.0)
MCV: 87.7 fL (ref 80.0–100.0)
Monocytes Absolute: 0.9 10*3/uL (ref 0.1–1.0)
Monocytes Relative: 10 %
Neutro Abs: 4.1 10*3/uL (ref 1.7–7.7)
Neutrophils Relative %: 46 %
Platelets: 267 10*3/uL (ref 150–400)
RBC: 5.51 MIL/uL (ref 4.22–5.81)
RDW: 14.4 % (ref 11.5–15.5)
WBC: 8.9 10*3/uL (ref 4.0–10.5)
nRBC: 0 % (ref 0.0–0.2)

## 2021-03-02 LAB — COMPREHENSIVE METABOLIC PANEL
ALT: 43 U/L (ref 0–44)
AST: 41 U/L (ref 15–41)
Albumin: 4.6 g/dL (ref 3.5–5.0)
Alkaline Phosphatase: 51 U/L (ref 38–126)
Anion gap: 11 (ref 5–15)
BUN: 14 mg/dL (ref 8–23)
CO2: 23 mmol/L (ref 22–32)
Calcium: 9.6 mg/dL (ref 8.9–10.3)
Chloride: 104 mmol/L (ref 98–111)
Creatinine, Ser: 0.93 mg/dL (ref 0.61–1.24)
GFR, Estimated: >60 mL/min (ref >60)
Glucose, Bld: 98 mg/dL (ref 70–99)
Potassium: 4.3 mmol/L (ref 3.5–5.1)
Sodium: 138 mmol/L (ref 135–145)
Total Bilirubin: 1.2 mg/dL (ref 0.3–1.2)
Total Protein: 8.2 g/dL — ABNORMAL HIGH (ref 6.5–8.1)

## 2021-03-02 LAB — URINALYSIS, ROUTINE W REFLEX MICROSCOPIC
Bacteria, UA: NONE SEEN
Bilirubin Urine: NEGATIVE
Glucose, UA: NEGATIVE mg/dL
Hgb urine dipstick: NEGATIVE
Ketones, ur: NEGATIVE mg/dL
Leukocytes,Ua: NEGATIVE
Nitrite: NEGATIVE
Protein, ur: NEGATIVE mg/dL
Specific Gravity, Urine: 1.03 (ref 1.005–1.030)
pH: 6 (ref 5.0–8.0)

## 2021-03-02 LAB — LIPASE, BLOOD: Lipase: 60 U/L — ABNORMAL HIGH (ref 11–51)

## 2021-03-02 MED ORDER — MORPHINE SULFATE (PF) 4 MG/ML IV SOLN
4.0000 mg | Freq: Once | INTRAVENOUS | Status: DC
  Filled 2021-03-02: qty 1

## 2021-03-02 MED ORDER — METHOCARBAMOL 500 MG PO TABS: 500.0000 mg | ORAL_TABLET | Freq: Three times a day (TID) | ORAL | 0 refills | Status: AC | PRN

## 2021-03-02 MED ORDER — NAPROXEN 500 MG PO TABS: 500.0000 mg | ORAL_TABLET | Freq: Two times a day (BID) | ORAL | 0 refills | Status: AC | PRN

## 2021-03-02 MED ORDER — METHOCARBAMOL 500 MG PO TABS
500.0000 mg | ORAL_TABLET | Freq: Once | ORAL | Status: AC
  Administered 2021-03-02: 500 mg via ORAL
  Filled 2021-03-02: qty 1

## 2021-03-02 MED ORDER — ACETAMINOPHEN 325 MG PO TABS
650.0000 mg | ORAL_TABLET | Freq: Once | ORAL | Status: AC
  Administered 2021-03-02: 650 mg via ORAL
  Filled 2021-03-02: qty 2

## 2021-03-02 MED ORDER — KETOROLAC TROMETHAMINE 15 MG/ML IJ SOLN
15.0000 mg | Freq: Once | INTRAMUSCULAR | Status: AC
  Administered 2021-03-02: 15 mg via INTRAMUSCULAR
  Filled 2021-03-02: qty 1

## 2021-03-02 MED ORDER — LIDOCAINE 5 % EX PTCH
2.0000 | MEDICATED_PATCH | CUTANEOUS | Status: DC
  Administered 2021-03-02: 2 via TRANSDERMAL
  Filled 2021-03-02: qty 2

## 2021-03-02 MED ORDER — LIDOCAINE 5 % EX PTCH: 1.0000 | MEDICATED_PATCH | Freq: Every day | CUTANEOUS | 0 refills | Status: AC | PRN

## 2021-03-02 NOTE — ED Notes (Signed)
Pt is aware of the need for a urine specimen and a urinal is at bedside.

## 2021-03-02 NOTE — ED Notes (Signed)
D/c paperwork reviewed with pt, including prescriptions.  Pt with no questions or concerns at time of d/c. Ambulatory to ED exit.  

## 2021-03-02 NOTE — Discharge Instructions (Addendum)
You were seen in the emergency department for back pain today.  At this time we suspect that your pain is related to a muscle strain/spasm.  Your CT scan showed your hiatal hernia, no other significant abnormalities.  Your blood work showed that your total protein level and your lipase were very minimally elevated, have these rechecked by your primary care provider.  I have prescribed you an anti-inflammatory medication and a muscle relaxer.  - Naproxen is a nonsteroidal anti-inflammatory medication that will help with pain and swelling. Be sure to take this medication as prescribed with food, 1 pill every 12 hours,  It should be taken with food, as it can cause stomach upset, and more seriously, stomach bleeding. Do not take other nonsteroidal anti-inflammatory medications with this such as Advil, Motrin, Aleve, Mobic, Goodie Powder, or Motrin.    - Robaxin is the muscle relaxer I have prescribed, this is meant to help with muscle tightness. Be aware that this medication may make you drowsy therefore the first time you take this it should be at a time you are in an environment where you can rest. Do not drive or operate heavy machinery when taking this medication. Do not drink alcohol or take other sedating medications with this medicine such as narcotics or benzodiazepines.   -Lidoderm patch: Apply 1 patch.  No significant pain once per day, remove and discard patch within 12 hours of application.  You make take Tylenol per over the counter dosing with these medications.   We have prescribed you new medication(s) today. Discuss the medications prescribed today with your pharmacist as they can have adverse effects and interactions with your other medicines including over the counter and prescribed medications. Seek medical evaluation if you start to experience new or abnormal symptoms after taking one of these medicines, seek care immediately if you start to experience difficulty breathing, feeling of  your throat closing, facial swelling, or rash as these could be indications of a more serious allergic reaction   The application of heat can help soothe the pain.  Maintaining your daily activities, including walking, is encourged, as it will help you get better faster than just staying in bed.  Follow-up with primary care within 3 days.  Return to the emergency department for new or worsening symptoms including but not limited to new or worsening pain, fever, numbness, weakness, loss of control of bowel or bladder function, inability to walk, or any other concerns.

## 2021-03-02 NOTE — ED Notes (Signed)
This nurse has asked pt to urinate multiple times. Pt states that he cannot. Provided a cup of water

## 2021-03-02 NOTE — ED Provider Notes (Signed)
  Physical Exam  BP (!) 155/105   Pulse (!) 54   Temp 98.9 F (37.2 C) (Oral)   Resp 15   SpO2 97%   Physical Exam Vitals and nursing note reviewed.  HENT:     Head: Normocephalic and atraumatic.     Mouth/Throat:     Mouth: Mucous membranes are moist.     Pharynx: No oropharyngeal exudate or posterior oropharyngeal erythema.  Eyes:     General:        Right eye: No discharge.        Left eye: No discharge.     Conjunctiva/sclera: Conjunctivae normal.  Cardiovascular:     Rate and Rhythm: Normal rate and regular rhythm.     Pulses: Normal pulses.  Pulmonary:     Effort: Pulmonary effort is normal. No respiratory distress.     Breath sounds: Normal breath sounds. No wheezing or rales.  Abdominal:     General: Bowel sounds are normal. There is no distension.     Palpations: Abdomen is soft.     Tenderness: There is no abdominal tenderness. There is no right CVA tenderness, left CVA tenderness, guarding or rebound.  Musculoskeletal:        General: No deformity.     Cervical back: Normal and neck supple. No bony tenderness.     Thoracic back: Normal. No bony tenderness.     Lumbar back: Spasms and tenderness present. No bony tenderness. Negative right straight leg raise test and negative left straight leg raise test.     Comments: Spasm and tenderness palpation of the right lumbar paraspinous musculature with spasm.  No trigger point or midline tenderness palpation.  Skin:    General: Skin is warm and dry.     Capillary Refill: Capillary refill takes less than 2 seconds.  Neurological:     General: No focal deficit present.     Mental Status: He is alert and oriented to person, place, and time. Mental status is at baseline.  Psychiatric:        Mood and Affect: Mood normal.    ED Course/Procedures     Procedures  MDM   Care as patient assumed from preceding ED provider Sam Petrucelli, PA-C.  Please see her associated note for further insight into the patient's ED  course.  In brief patient with right-sided low back pain that is exacerbated movement but completely resolves at rest.  Denies any urinary symptoms.  At time of shift change patient is awaiting UA.  CBC unremarkable, CMP unremarkable, lipase very mildly elevated to 60.  CT of the abdomen pelvis unremarkable.  UA unremarkable.  Suspect patient's back pain is most consistent with musculoskeletal pain.  Patient has been prescribed medication by preceding ED provider for muscular injury and may follow-up with his primary care doctor.  Benito voiced understanding of his medical evaluation and treatment.  Each of his questions was answered to his expressed satisfaction.  Return precautions were given.  Patient is well-appearing, stable, and appropriate for discharge at this time.  This chart was dictated using voice recognition software, Dragon. Despite the best efforts of this provider to proofread and correct errors, errors may still occur which can change documentation meaning.       Paris Lore, PA-C 03/02/21 5176    Mancel Bale, MD 03/02/21 1725

## 2021-03-02 NOTE — ED Notes (Addendum)
Pt sleeping, easily awoken, informed of need for urine sample. Pt verbalized understanding.
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
## 2021-07-23 ENCOUNTER — Other Ambulatory Visit: Payer: Self-pay | Admitting: Physician Assistant

## 2021-07-23 DIAGNOSIS — R945 Abnormal results of liver function studies: Secondary | ICD-10-CM

## 2021-07-28 ENCOUNTER — Other Ambulatory Visit: Payer: Self-pay

## 2021-07-31 ENCOUNTER — Other Ambulatory Visit: Payer: Self-pay

## 2021-07-31 ENCOUNTER — Ambulatory Visit (INDEPENDENT_AMBULATORY_CARE_PROVIDER_SITE_OTHER): Payer: PRIVATE HEALTH INSURANCE

## 2021-07-31 DIAGNOSIS — R945 Abnormal results of liver function studies: Secondary | ICD-10-CM | POA: Diagnosis not present

## 2021-12-16 IMAGING — CT CT ABD-PELV W/O CM
2 of 4 series · 16 of 46 positions shown, 18 images · non-contrast
Comparison: CT chest 03/07/2020.  CT abdomen and pelvis 09/22/2013

CLINICAL DATA: Abdominal distension and right flank pain beginning
yesterday.

EXAM:
CT ABDOMEN AND PELVIS WITHOUT CONTRAST
TECHNIQUE: Multidetector CT imaging of the abdomen and pelvis was performed
following the standard protocol without IV contrast.

[Series 2: axial st · axial · 0.92mm/px · z∈[-590,-115]mm · 13 of 107 slices shown, 15 images]
[im 6/107  soft-tissue]
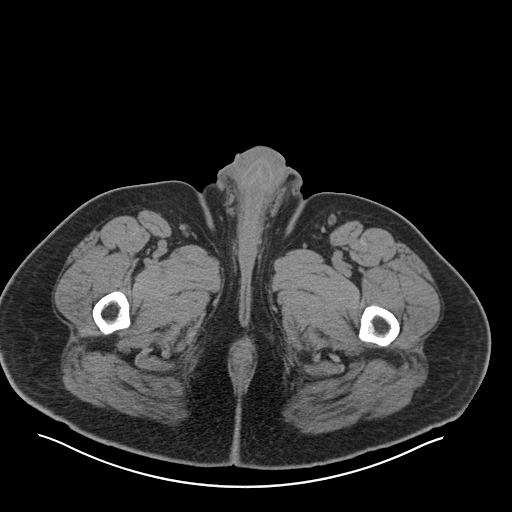
[im 6/107  bone]
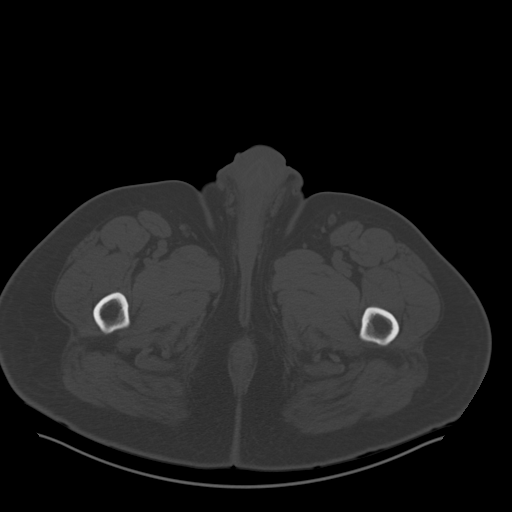
[im 12/107  soft-tissue]
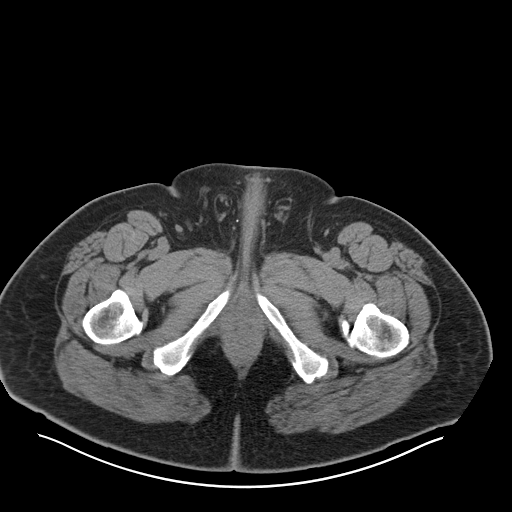
[im 24/107  soft-tissue]
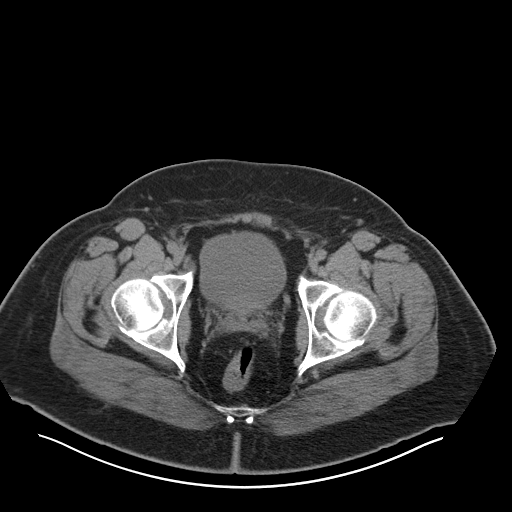
[im 30/107  soft-tissue]
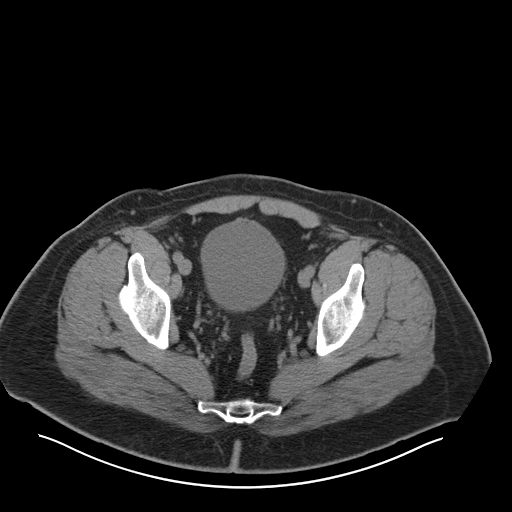
[im 36/107  soft-tissue]
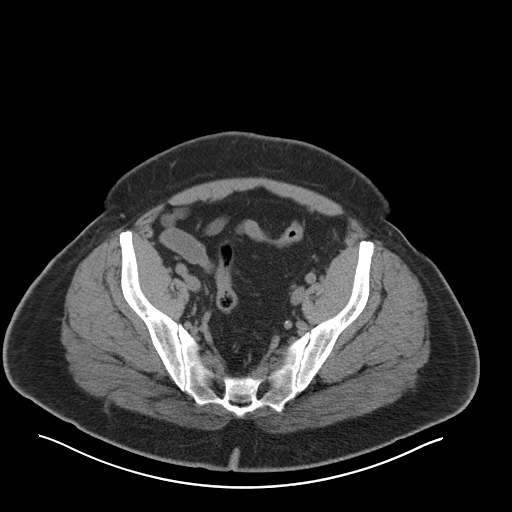
[im 48/107  soft-tissue]
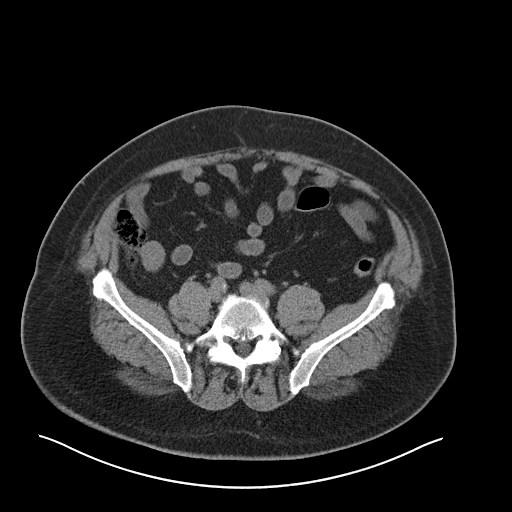
[im 54/107  soft-tissue]
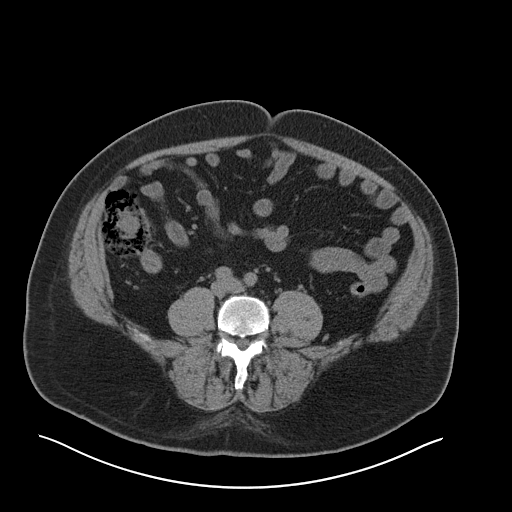
[im 59/107  soft-tissue]
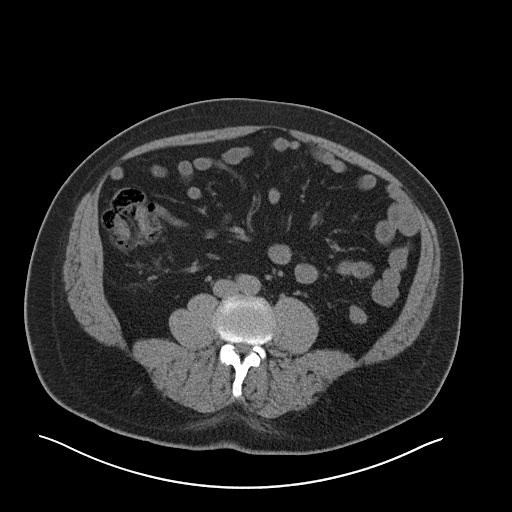
[im 71/107  soft-tissue]
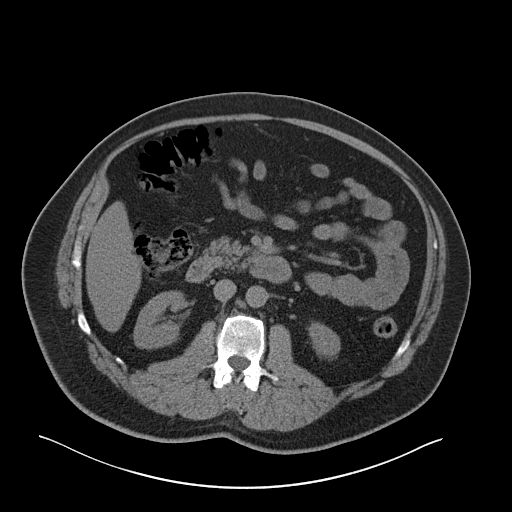
[im 71/107  bone]
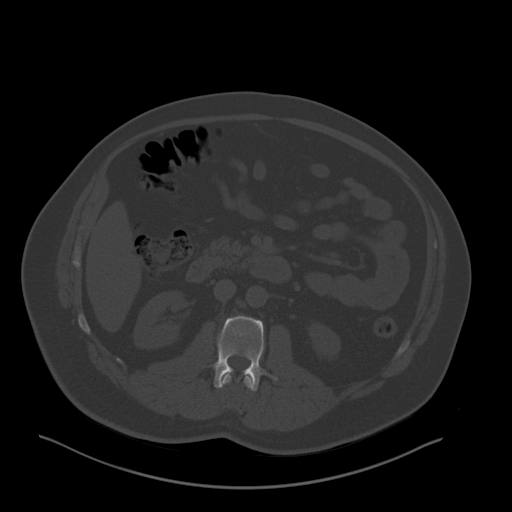
[im 77/107  soft-tissue]
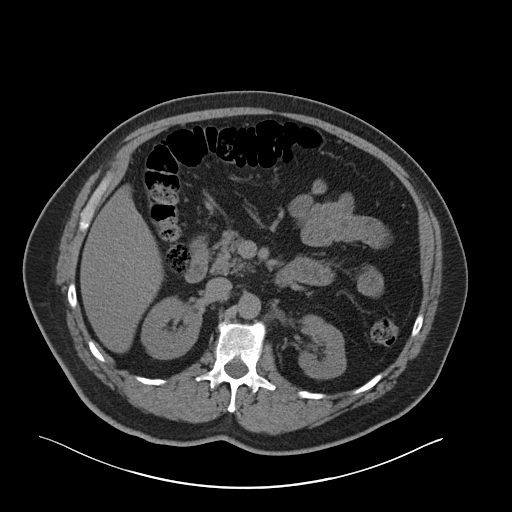
[im 83/107  soft-tissue]
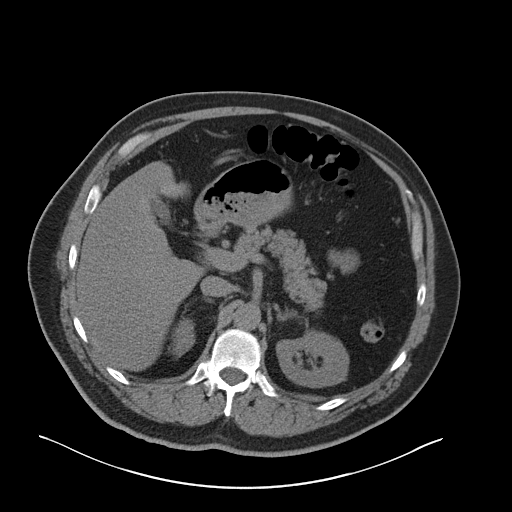
[im 95/107  soft-tissue]
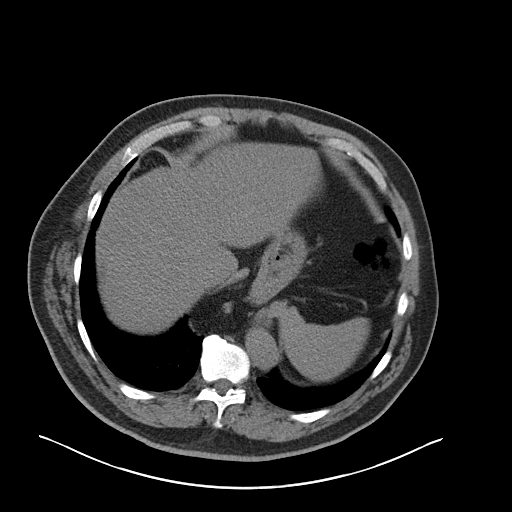
[im 101/107  soft-tissue]
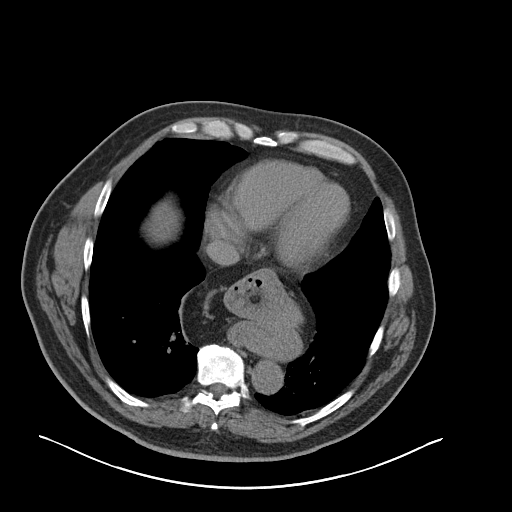

[Series 5: coronal st · coronal · 0.92mm/px · 3 of 193 slices shown]
[im 65/193  soft-tissue]
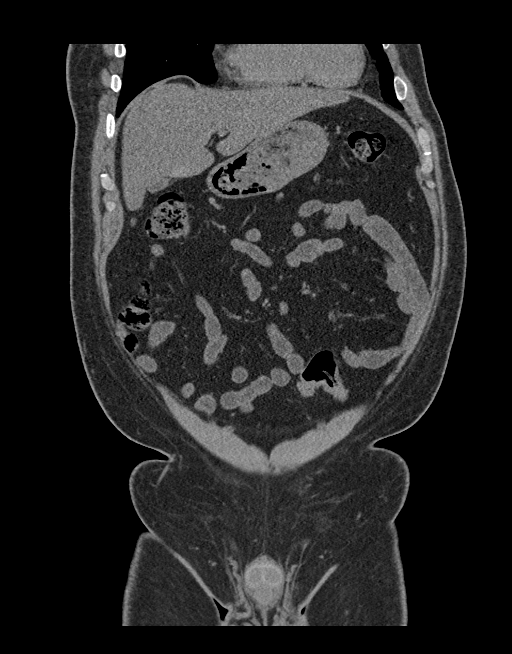
[im 86/193  soft-tissue]
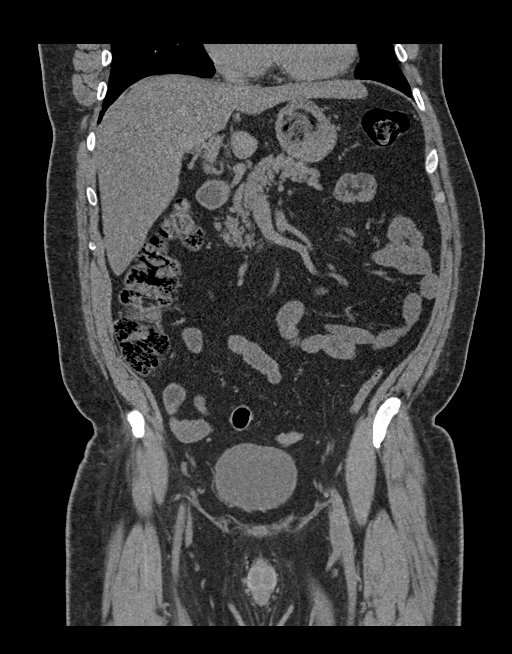
[im 107/193  soft-tissue]
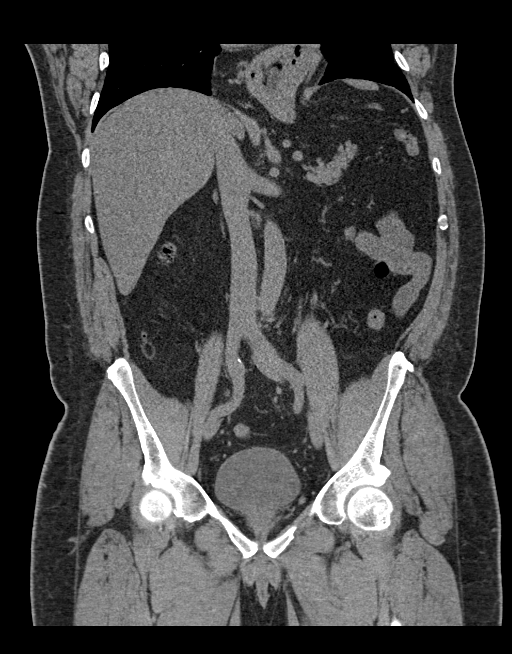

[16 of 46 positions shown; findings below may reference images not displayed]

FINDINGS: Lower chest: Lung bases are clear.  Large esophageal hiatal hernia.

Hepatobiliary: No focal liver abnormality is seen. No gallstones,
gallbladder wall thickening, or biliary dilatation.

Pancreas: Unremarkable. No pancreatic ductal dilatation or
surrounding inflammatory changes.

Spleen: Normal in size without focal abnormality.

Adrenals/Urinary Tract: Adrenal glands are unremarkable. Kidneys are
normal, without renal calculi, focal lesion, or hydronephrosis.
Bladder is unremarkable.

Stomach/Bowel: Stomach, small bowel, and colon are not abnormally
distended. No wall thickening or inflammatory changes appreciated.
Appendix is normal.

Vascular/Lymphatic: No significant vascular findings are present. No
enlarged abdominal or pelvic lymph nodes.

Reproductive: Prostate is unremarkable.

Other: No free air or free fluid in the abdomen. Abdominal wall
musculature appears intact.

Musculoskeletal: No acute or significant osseous findings.
IMPRESSION: 1. No renal or ureteral stone or obstruction.
2. No evidence of bowel obstruction or inflammation.
3. Large esophageal hiatal hernia.

## 2022-03-19 DIAGNOSIS — Z981 Arthrodesis status: Secondary | ICD-10-CM | POA: Diagnosis not present

## 2022-03-19 DIAGNOSIS — R102 Pelvic and perineal pain: Secondary | ICD-10-CM | POA: Diagnosis not present

## 2022-03-19 DIAGNOSIS — E870 Hyperosmolality and hypernatremia: Secondary | ICD-10-CM | POA: Diagnosis not present

## 2022-03-19 DIAGNOSIS — M47814 Spondylosis without myelopathy or radiculopathy, thoracic region: Secondary | ICD-10-CM | POA: Diagnosis not present

## 2022-03-19 DIAGNOSIS — I7121 Aneurysm of the ascending aorta, without rupture: Secondary | ICD-10-CM | POA: Diagnosis not present

## 2022-03-19 DIAGNOSIS — S329XXA Fracture of unspecified parts of lumbosacral spine and pelvis, initial encounter for closed fracture: Secondary | ICD-10-CM | POA: Diagnosis not present

## 2022-03-19 DIAGNOSIS — Z041 Encounter for examination and observation following transport accident: Secondary | ICD-10-CM | POA: Diagnosis not present

## 2022-03-19 DIAGNOSIS — J9690 Respiratory failure, unspecified, unspecified whether with hypoxia or hypercapnia: Secondary | ICD-10-CM | POA: Diagnosis not present

## 2022-03-19 DIAGNOSIS — S92411A Displaced fracture of proximal phalanx of right great toe, initial encounter for closed fracture: Secondary | ICD-10-CM | POA: Diagnosis not present

## 2022-03-19 DIAGNOSIS — R945 Abnormal results of liver function studies: Secondary | ICD-10-CM | POA: Diagnosis not present

## 2022-03-19 DIAGNOSIS — S32309A Unspecified fracture of unspecified ilium, initial encounter for closed fracture: Secondary | ICD-10-CM | POA: Diagnosis not present

## 2022-03-19 DIAGNOSIS — S32039A Unspecified fracture of third lumbar vertebra, initial encounter for closed fracture: Secondary | ICD-10-CM | POA: Diagnosis not present

## 2022-03-19 DIAGNOSIS — M79661 Pain in right lower leg: Secondary | ICD-10-CM | POA: Diagnosis not present

## 2022-03-19 DIAGNOSIS — R918 Other nonspecific abnormal finding of lung field: Secondary | ICD-10-CM | POA: Diagnosis not present

## 2022-03-19 DIAGNOSIS — K449 Diaphragmatic hernia without obstruction or gangrene: Secondary | ICD-10-CM | POA: Diagnosis not present

## 2022-03-19 DIAGNOSIS — S36409A Unspecified injury of unspecified part of small intestine, initial encounter: Secondary | ICD-10-CM | POA: Diagnosis not present

## 2022-03-19 DIAGNOSIS — S99912A Unspecified injury of left ankle, initial encounter: Secondary | ICD-10-CM | POA: Diagnosis not present

## 2022-03-19 DIAGNOSIS — S2223XA Sternal manubrial dissociation, initial encounter for closed fracture: Secondary | ICD-10-CM | POA: Diagnosis not present

## 2022-03-19 DIAGNOSIS — S34104A Unspecified injury to L4 level of lumbar spinal cord, initial encounter: Secondary | ICD-10-CM | POA: Diagnosis not present

## 2022-03-19 DIAGNOSIS — Z743 Need for continuous supervision: Secondary | ICD-10-CM | POA: Diagnosis not present

## 2022-03-19 DIAGNOSIS — D62 Acute posthemorrhagic anemia: Secondary | ICD-10-CM | POA: Diagnosis not present

## 2022-03-19 DIAGNOSIS — M47812 Spondylosis without myelopathy or radiculopathy, cervical region: Secondary | ICD-10-CM | POA: Diagnosis not present

## 2022-03-19 DIAGNOSIS — K567 Ileus, unspecified: Secondary | ICD-10-CM | POA: Diagnosis not present

## 2022-03-19 DIAGNOSIS — R079 Chest pain, unspecified: Secondary | ICD-10-CM | POA: Diagnosis not present

## 2022-03-19 DIAGNOSIS — S42019A Nondisplaced fracture of sternal end of unspecified clavicle, initial encounter for closed fracture: Secondary | ICD-10-CM | POA: Diagnosis not present

## 2022-03-19 DIAGNOSIS — N5089 Other specified disorders of the male genital organs: Secondary | ICD-10-CM | POA: Diagnosis not present

## 2022-03-19 DIAGNOSIS — J189 Pneumonia, unspecified organism: Secondary | ICD-10-CM | POA: Diagnosis not present

## 2022-03-19 DIAGNOSIS — Z7189 Other specified counseling: Secondary | ICD-10-CM | POA: Diagnosis not present

## 2022-03-19 DIAGNOSIS — Z6838 Body mass index (BMI) 38.0-38.9, adult: Secondary | ICD-10-CM | POA: Diagnosis not present

## 2022-03-19 DIAGNOSIS — R222 Localized swelling, mass and lump, trunk: Secondary | ICD-10-CM | POA: Diagnosis not present

## 2022-03-19 DIAGNOSIS — S32811A Multiple fractures of pelvis with unstable disruption of pelvic ring, initial encounter for closed fracture: Secondary | ICD-10-CM | POA: Diagnosis not present

## 2022-03-19 DIAGNOSIS — S99911A Unspecified injury of right ankle, initial encounter: Secondary | ICD-10-CM | POA: Diagnosis not present

## 2022-03-19 DIAGNOSIS — S2220XA Unspecified fracture of sternum, initial encounter for closed fracture: Secondary | ICD-10-CM | POA: Diagnosis not present

## 2022-03-19 DIAGNOSIS — S3289XA Fracture of other parts of pelvis, initial encounter for closed fracture: Secondary | ICD-10-CM | POA: Diagnosis not present

## 2022-03-19 DIAGNOSIS — J988 Other specified respiratory disorders: Secondary | ICD-10-CM | POA: Diagnosis not present

## 2022-03-19 DIAGNOSIS — R109 Unspecified abdominal pain: Secondary | ICD-10-CM | POA: Diagnosis not present

## 2022-03-19 DIAGNOSIS — S32001A Stable burst fracture of unspecified lumbar vertebra, initial encounter for closed fracture: Secondary | ICD-10-CM | POA: Diagnosis not present

## 2022-03-19 DIAGNOSIS — E079 Disorder of thyroid, unspecified: Secondary | ICD-10-CM | POA: Diagnosis not present

## 2022-03-19 DIAGNOSIS — S064XAA Epidural hemorrhage with loss of consciousness status unknown, initial encounter: Secondary | ICD-10-CM | POA: Diagnosis not present

## 2022-03-19 DIAGNOSIS — M48061 Spinal stenosis, lumbar region without neurogenic claudication: Secondary | ICD-10-CM | POA: Diagnosis not present

## 2022-03-19 DIAGNOSIS — K651 Peritoneal abscess: Secondary | ICD-10-CM | POA: Diagnosis not present

## 2022-03-19 DIAGNOSIS — J90 Pleural effusion, not elsewhere classified: Secondary | ICD-10-CM | POA: Diagnosis not present

## 2022-03-19 DIAGNOSIS — S22041A Stable burst fracture of fourth thoracic vertebra, initial encounter for closed fracture: Secondary | ICD-10-CM | POA: Diagnosis not present

## 2022-03-19 DIAGNOSIS — Z515 Encounter for palliative care: Secondary | ICD-10-CM | POA: Diagnosis not present

## 2022-03-19 DIAGNOSIS — S9031XA Contusion of right foot, initial encounter: Secondary | ICD-10-CM | POA: Diagnosis not present

## 2022-03-19 DIAGNOSIS — T796XXA Traumatic ischemia of muscle, initial encounter: Secondary | ICD-10-CM | POA: Diagnosis not present

## 2022-03-19 DIAGNOSIS — S62647A Nondisplaced fracture of proximal phalanx of left little finger, initial encounter for closed fracture: Secondary | ICD-10-CM | POA: Diagnosis not present

## 2022-03-19 DIAGNOSIS — S8991XA Unspecified injury of right lower leg, initial encounter: Secondary | ICD-10-CM | POA: Diagnosis not present

## 2022-03-19 DIAGNOSIS — R9401 Abnormal electroencephalogram [EEG]: Secondary | ICD-10-CM | POA: Diagnosis not present

## 2022-03-19 DIAGNOSIS — Z4682 Encounter for fitting and adjustment of non-vascular catheter: Secondary | ICD-10-CM | POA: Diagnosis not present

## 2022-03-19 DIAGNOSIS — T794XXA Traumatic shock, initial encounter: Secondary | ICD-10-CM | POA: Diagnosis not present

## 2022-03-19 DIAGNOSIS — S62646A Nondisplaced fracture of proximal phalanx of right little finger, initial encounter for closed fracture: Secondary | ICD-10-CM | POA: Diagnosis not present

## 2022-03-19 DIAGNOSIS — E039 Hypothyroidism, unspecified: Secondary | ICD-10-CM | POA: Diagnosis not present

## 2022-03-19 DIAGNOSIS — S299XXA Unspecified injury of thorax, initial encounter: Secondary | ICD-10-CM | POA: Diagnosis not present

## 2022-03-19 DIAGNOSIS — A419 Sepsis, unspecified organism: Secondary | ICD-10-CM | POA: Diagnosis not present

## 2022-03-19 DIAGNOSIS — N289 Disorder of kidney and ureter, unspecified: Secondary | ICD-10-CM | POA: Diagnosis not present

## 2022-03-19 DIAGNOSIS — S32301A Unspecified fracture of right ilium, initial encounter for closed fracture: Secondary | ICD-10-CM | POA: Diagnosis not present

## 2022-03-19 DIAGNOSIS — T8131XA Disruption of external operation (surgical) wound, not elsewhere classified, initial encounter: Secondary | ICD-10-CM | POA: Diagnosis not present

## 2022-03-19 DIAGNOSIS — R6889 Other general symptoms and signs: Secondary | ICD-10-CM | POA: Diagnosis not present

## 2022-03-19 DIAGNOSIS — J9600 Acute respiratory failure, unspecified whether with hypoxia or hypercapnia: Secondary | ICD-10-CM | POA: Diagnosis not present

## 2022-03-19 DIAGNOSIS — S32041A Stable burst fracture of fourth lumbar vertebra, initial encounter for closed fracture: Secondary | ICD-10-CM | POA: Diagnosis not present

## 2022-03-19 DIAGNOSIS — D649 Anemia, unspecified: Secondary | ICD-10-CM | POA: Diagnosis not present

## 2022-03-19 DIAGNOSIS — T8130XA Disruption of wound, unspecified, initial encounter: Secondary | ICD-10-CM | POA: Diagnosis not present

## 2022-03-19 DIAGNOSIS — M109 Gout, unspecified: Secondary | ICD-10-CM | POA: Diagnosis not present

## 2022-03-19 DIAGNOSIS — S32810A Multiple fractures of pelvis with stable disruption of pelvic ring, initial encounter for closed fracture: Secondary | ICD-10-CM | POA: Diagnosis not present

## 2022-03-19 DIAGNOSIS — R140 Abdominal distension (gaseous): Secondary | ICD-10-CM | POA: Diagnosis not present

## 2022-03-19 DIAGNOSIS — S32049A Unspecified fracture of fourth lumbar vertebra, initial encounter for closed fracture: Secondary | ICD-10-CM | POA: Diagnosis not present

## 2022-03-19 DIAGNOSIS — S9032XA Contusion of left foot, initial encounter: Secondary | ICD-10-CM | POA: Diagnosis not present

## 2022-03-19 DIAGNOSIS — S91012A Laceration without foreign body, left ankle, initial encounter: Secondary | ICD-10-CM | POA: Diagnosis not present

## 2022-03-19 DIAGNOSIS — R578 Other shock: Secondary | ICD-10-CM | POA: Diagnosis not present

## 2022-03-19 DIAGNOSIS — S0033XA Contusion of nose, initial encounter: Secondary | ICD-10-CM | POA: Diagnosis not present

## 2022-03-19 DIAGNOSIS — S2221XA Fracture of manubrium, initial encounter for closed fracture: Secondary | ICD-10-CM | POA: Diagnosis not present

## 2022-03-19 DIAGNOSIS — Z87898 Personal history of other specified conditions: Secondary | ICD-10-CM | POA: Diagnosis not present

## 2022-03-19 DIAGNOSIS — J984 Other disorders of lung: Secondary | ICD-10-CM | POA: Diagnosis not present

## 2022-03-19 DIAGNOSIS — S22089A Unspecified fracture of T11-T12 vertebra, initial encounter for closed fracture: Secondary | ICD-10-CM | POA: Diagnosis not present

## 2022-03-19 DIAGNOSIS — N501 Vascular disorders of male genital organs: Secondary | ICD-10-CM | POA: Diagnosis not present

## 2022-03-19 DIAGNOSIS — R600 Localized edema: Secondary | ICD-10-CM | POA: Diagnosis not present

## 2022-03-19 DIAGNOSIS — S36408A Unspecified injury of other part of small intestine, initial encounter: Secondary | ICD-10-CM | POA: Diagnosis not present

## 2022-03-19 DIAGNOSIS — S199XXA Unspecified injury of neck, initial encounter: Secondary | ICD-10-CM | POA: Diagnosis not present

## 2022-03-19 DIAGNOSIS — R0902 Hypoxemia: Secondary | ICD-10-CM | POA: Diagnosis not present

## 2022-03-19 DIAGNOSIS — Z9911 Dependence on respirator [ventilator] status: Secondary | ICD-10-CM | POA: Diagnosis not present

## 2022-03-19 DIAGNOSIS — M47816 Spondylosis without myelopathy or radiculopathy, lumbar region: Secondary | ICD-10-CM | POA: Diagnosis not present

## 2022-03-19 DIAGNOSIS — G8911 Acute pain due to trauma: Secondary | ICD-10-CM | POA: Diagnosis not present

## 2022-03-19 DIAGNOSIS — Z4659 Encounter for fitting and adjustment of other gastrointestinal appliance and device: Secondary | ICD-10-CM | POA: Diagnosis not present

## 2022-03-19 DIAGNOSIS — S91312A Laceration without foreign body, left foot, initial encounter: Secondary | ICD-10-CM | POA: Diagnosis not present

## 2022-03-19 DIAGNOSIS — S2690XA Unspecified injury of heart, unspecified with or without hemopericardium, initial encounter: Secondary | ICD-10-CM | POA: Diagnosis not present

## 2022-03-19 DIAGNOSIS — M7989 Other specified soft tissue disorders: Secondary | ICD-10-CM | POA: Diagnosis not present

## 2022-03-19 DIAGNOSIS — R0602 Shortness of breath: Secondary | ICD-10-CM | POA: Diagnosis not present

## 2022-03-19 DIAGNOSIS — R0789 Other chest pain: Secondary | ICD-10-CM | POA: Diagnosis not present

## 2022-03-19 DIAGNOSIS — S3991XA Unspecified injury of abdomen, initial encounter: Secondary | ICD-10-CM | POA: Diagnosis not present

## 2022-03-19 DIAGNOSIS — J80 Acute respiratory distress syndrome: Secondary | ICD-10-CM | POA: Diagnosis not present

## 2022-03-19 DIAGNOSIS — R7401 Elevation of levels of liver transaminase levels: Secondary | ICD-10-CM | POA: Diagnosis not present

## 2022-03-19 DIAGNOSIS — S8992XA Unspecified injury of left lower leg, initial encounter: Secondary | ICD-10-CM | POA: Diagnosis not present

## 2022-03-19 DIAGNOSIS — T07XXXA Unspecified multiple injuries, initial encounter: Secondary | ICD-10-CM | POA: Diagnosis not present

## 2022-03-19 DIAGNOSIS — S37009A Unspecified injury of unspecified kidney, initial encounter: Secondary | ICD-10-CM | POA: Diagnosis not present

## 2022-03-19 DIAGNOSIS — S332XXA Dislocation of sacroiliac and sacrococcygeal joint, initial encounter: Secondary | ICD-10-CM | POA: Diagnosis not present

## 2022-03-19 DIAGNOSIS — S91311A Laceration without foreign body, right foot, initial encounter: Secondary | ICD-10-CM | POA: Diagnosis not present

## 2022-03-19 DIAGNOSIS — T8140XA Infection following a procedure, unspecified, initial encounter: Secondary | ICD-10-CM | POA: Diagnosis not present

## 2022-03-19 DIAGNOSIS — S322XXA Fracture of coccyx, initial encounter for closed fracture: Secondary | ICD-10-CM | POA: Diagnosis not present

## 2022-03-19 DIAGNOSIS — S32810B Multiple fractures of pelvis with stable disruption of pelvic ring, initial encounter for open fracture: Secondary | ICD-10-CM | POA: Diagnosis not present

## 2022-03-19 DIAGNOSIS — K661 Hemoperitoneum: Secondary | ICD-10-CM | POA: Diagnosis not present

## 2022-03-19 DIAGNOSIS — S3993XA Unspecified injury of pelvis, initial encounter: Secondary | ICD-10-CM | POA: Diagnosis not present

## 2022-04-09 DIAGNOSIS — Z981 Arthrodesis status: Secondary | ICD-10-CM | POA: Diagnosis not present

## 2022-04-09 DIAGNOSIS — K449 Diaphragmatic hernia without obstruction or gangrene: Secondary | ICD-10-CM | POA: Diagnosis not present

## 2022-04-09 DIAGNOSIS — K7290 Hepatic failure, unspecified without coma: Secondary | ICD-10-CM | POA: Diagnosis not present

## 2022-04-14 DEATH — deceased

## 2022-05-17 IMAGING — US US ABDOMEN COMPLETE
1 series · 14 of 25 positions shown · non-contrast
Comparison: 03/01/2021

CLINICAL DATA: Abnormal LFTs

EXAM:
ABDOMEN ULTRASOUND COMPLETE

[Series 1: us abdomen complete · 14 of 78 slices shown]
[im 1/78]
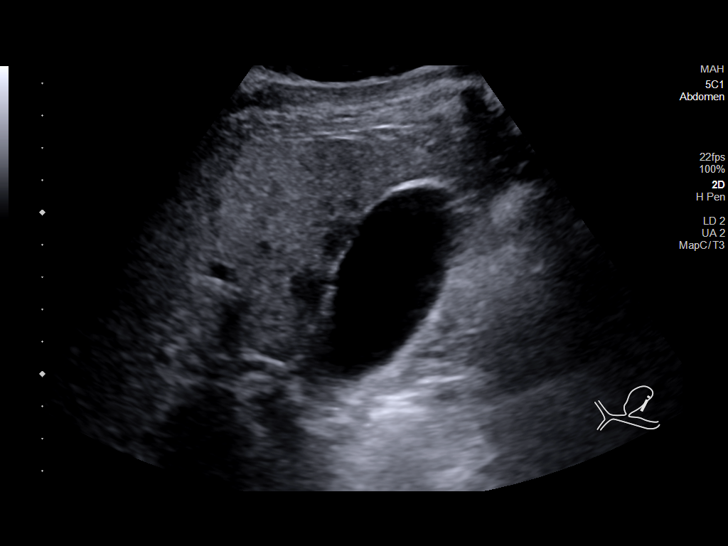
[im 7/78]
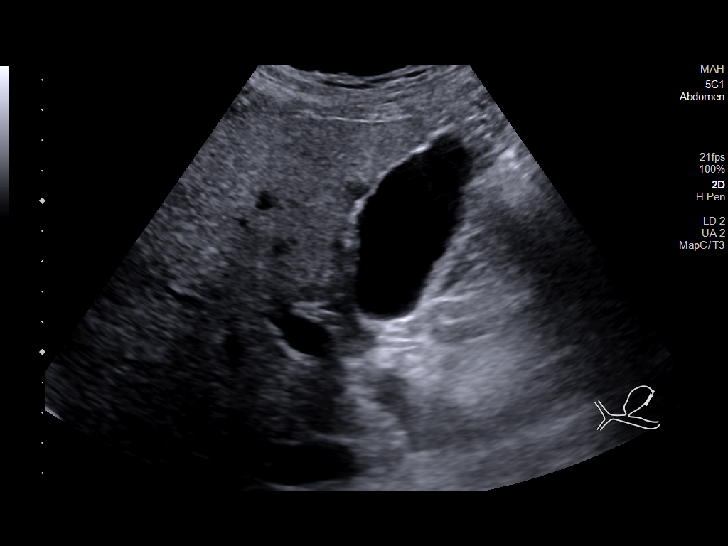
[im 13/78]
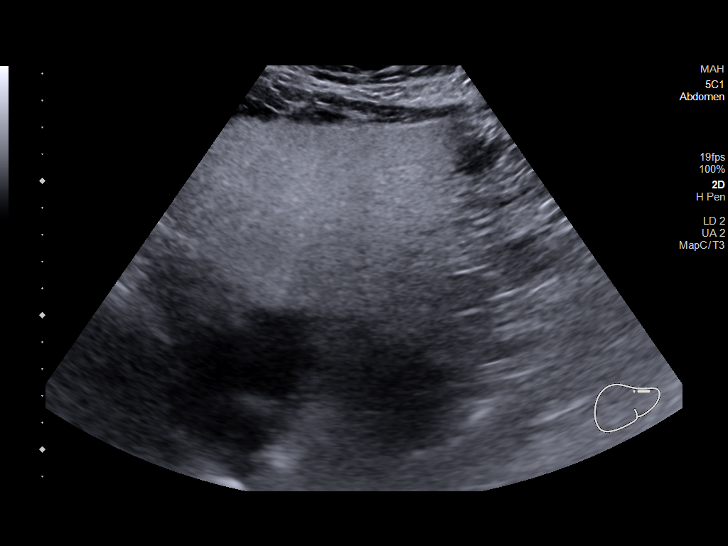
[im 20/78]
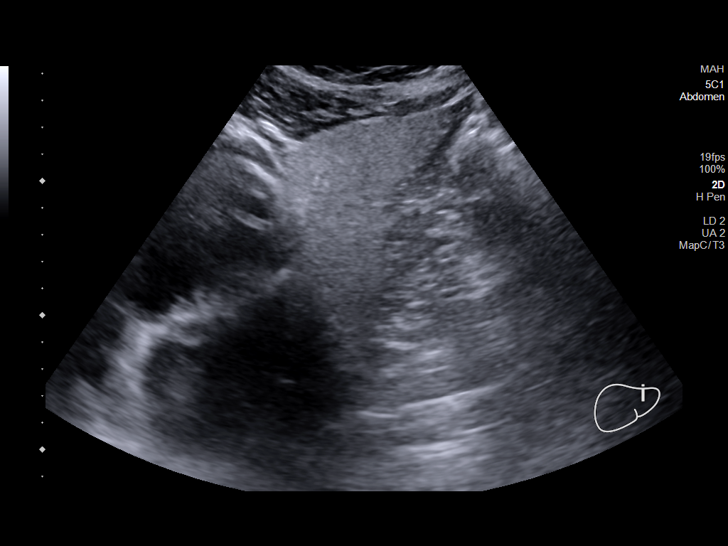
[im 26/78]
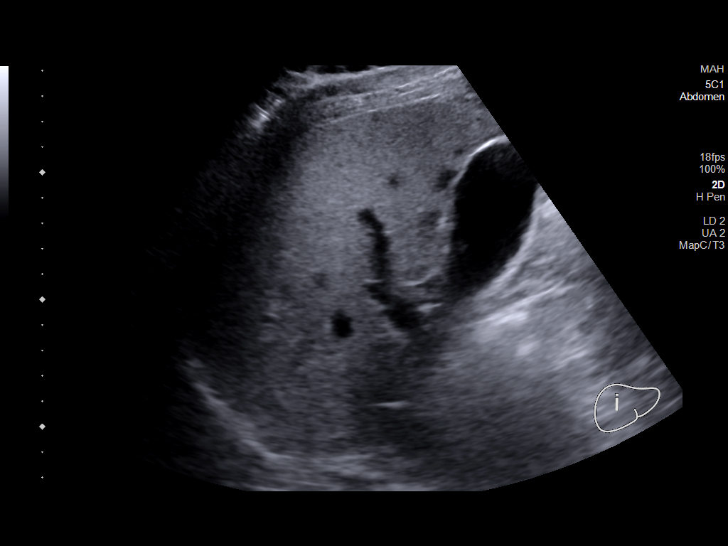
[im 29/78]
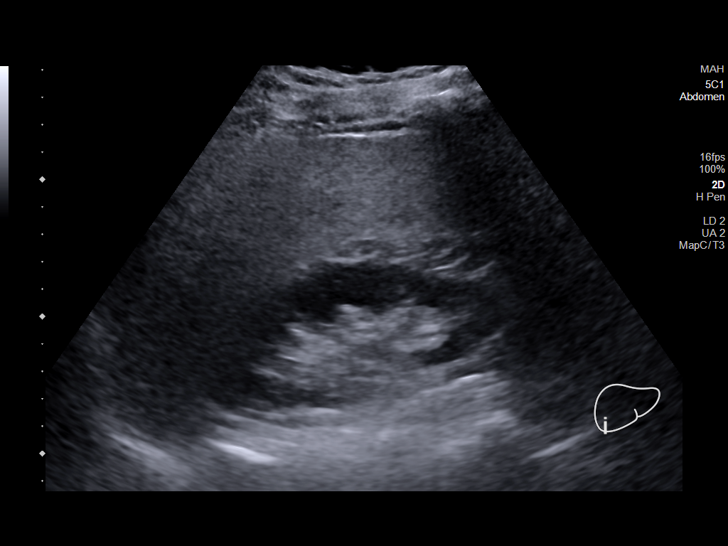
[im 36/78]
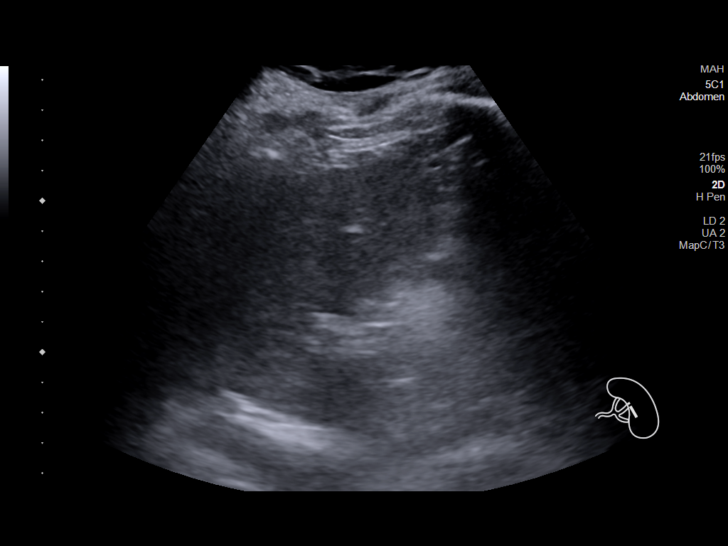
[im 42/78]
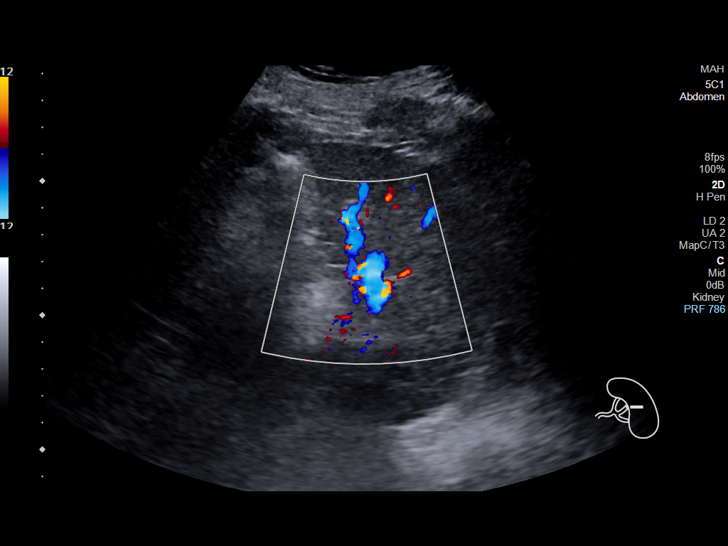
[im 49/78]
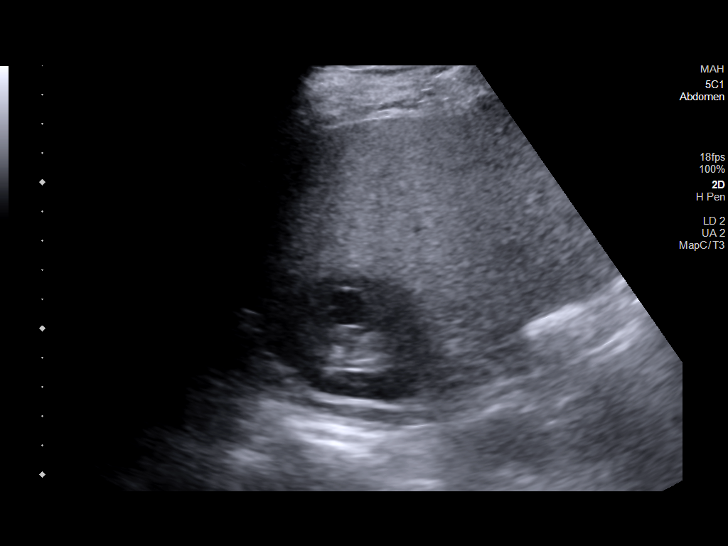
[im 52/78]
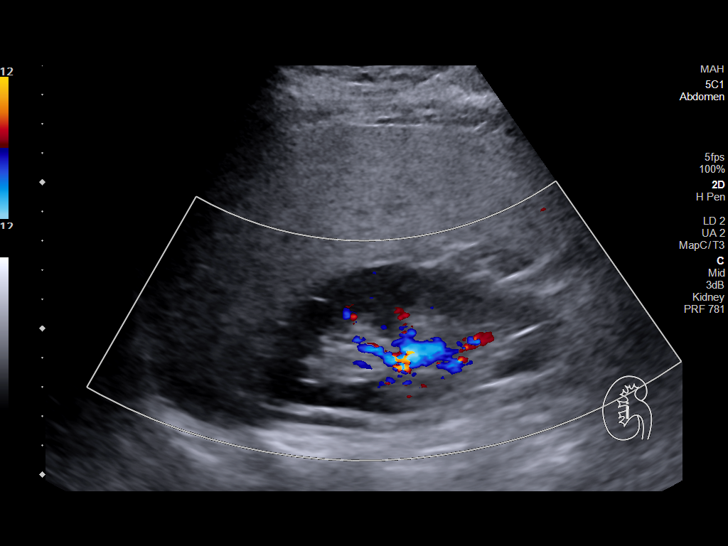
[im 58/78]
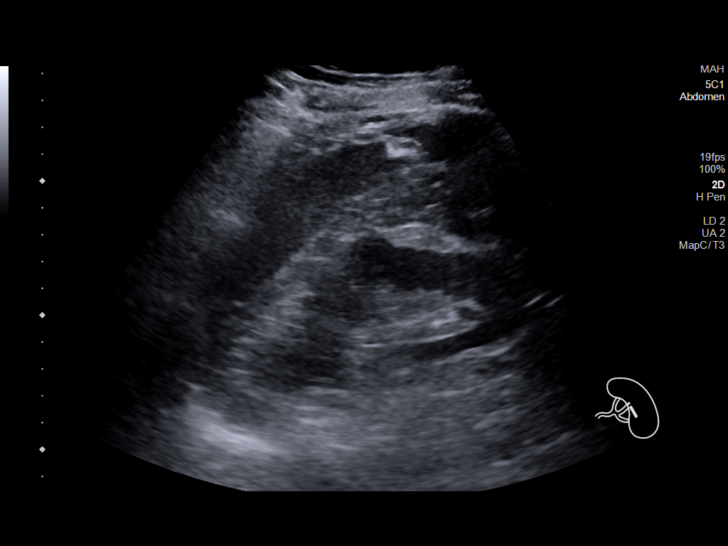
[im 65/78]
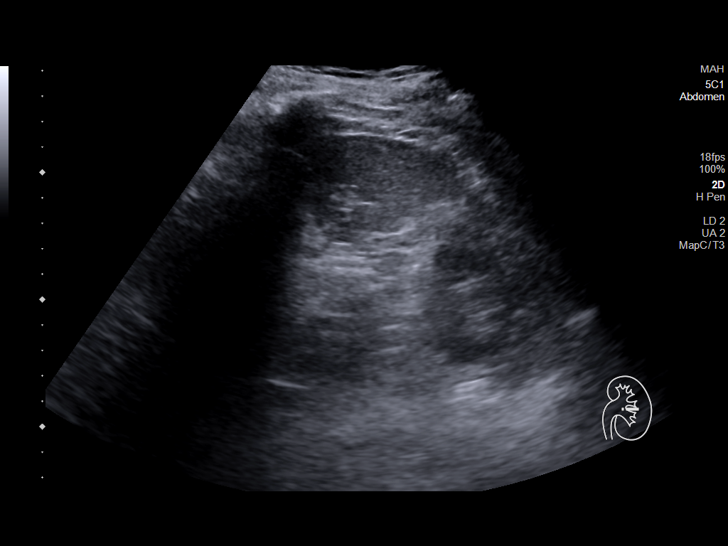
[im 71/78]
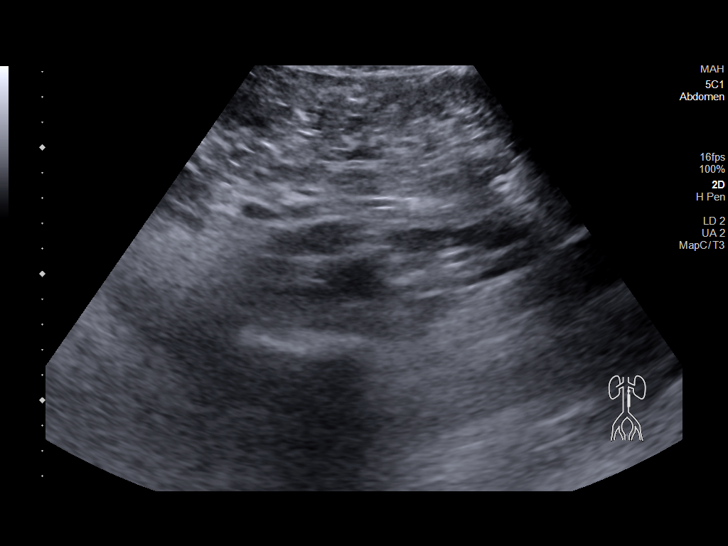
[im 78/78]
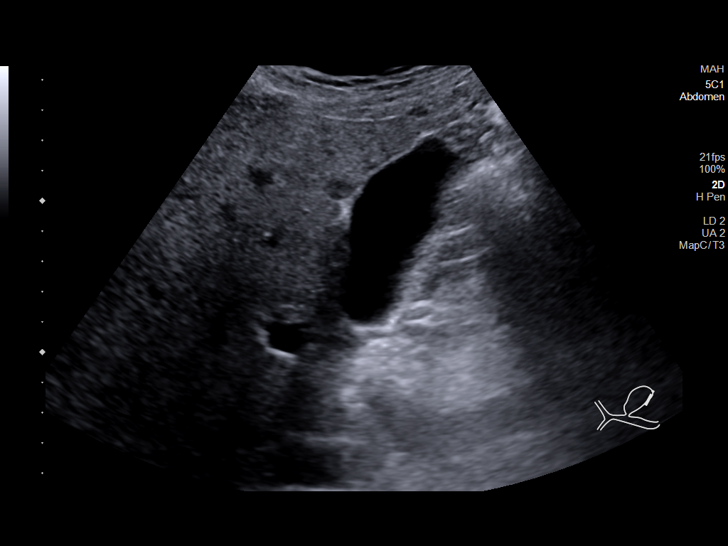

[14 of 25 positions shown; findings below may reference images not displayed]

FINDINGS: Gallbladder: No gallstones or wall thickening visualized. No
sonographic Murphy sign noted by sonographer.

Common bile duct: Diameter: 3.8 mm.

Liver: Diffusely increased in echogenicity with areas of decreased
echogenicity adjacent to the gallbladder fossa consistent with focal
fatty sparing. Portal vein is patent on color Doppler imaging with
normal direction of blood flow towards the liver.

IVC: Not well visualized.

Pancreas: Visualized portion unremarkable.

Spleen: Size and appearance within normal limits.

Right Kidney: Length: 11.4 cm. 1.3 cm simple cyst is noted within
the upper pole of the right kidney. No mass lesion or hydronephrosis
is noted.

Left Kidney: Length: 11.7 cm. Echogenicity within normal limits. No
mass or hydronephrosis visualized.

Abdominal aorta: No aneurysm visualized.

Other findings: None.
IMPRESSION: Right renal simple cyst

Fatty infiltration of the liver as described.
